# Patient Record
Sex: Female | Born: 1955 | Race: White | Hispanic: No | Marital: Married | Smoking: Former smoker
Health system: Southern US, Community
[De-identification: ages and names within clinical notes are randomized; demographics above are authoritative.]

## PROBLEM LIST (undated history)

## (undated) DIAGNOSIS — Z8669 Personal history of other diseases of the nervous system and sense organs: Secondary | ICD-10-CM

## (undated) DIAGNOSIS — C801 Malignant (primary) neoplasm, unspecified: Secondary | ICD-10-CM

## (undated) DIAGNOSIS — Z9889 Other specified postprocedural states: Secondary | ICD-10-CM

## (undated) HISTORY — PX: EYE SURGERY: SHX253

## (undated) HISTORY — PX: SHOULDER SURGERY: SHX246

## (undated) HISTORY — DX: Other specified postprocedural states: Z98.890

## (undated) HISTORY — PX: ABDOMINAL SURGERY: SHX537

## (undated) HISTORY — PX: HEMORRHOID SURGERY: SHX153

## (undated) HISTORY — PX: WRIST SURGERY: SHX841

## (undated) HISTORY — PX: BREAST SURGERY: SHX581

## (undated) HISTORY — DX: Personal history of other diseases of the nervous system and sense organs: Z86.69

## (undated) HISTORY — DX: Malignant (primary) neoplasm, unspecified: C80.1

---

## 2020-01-28 ENCOUNTER — Other Ambulatory Visit: Payer: Self-pay

## 2020-01-28 ENCOUNTER — Encounter: Payer: Self-pay | Admitting: Psychiatry

## 2020-01-28 ENCOUNTER — Encounter (INDEPENDENT_AMBULATORY_CARE_PROVIDER_SITE_OTHER): Payer: Self-pay

## 2020-01-28 ENCOUNTER — Ambulatory Visit (INDEPENDENT_AMBULATORY_CARE_PROVIDER_SITE_OTHER): Payer: BC Managed Care – PPO | Admitting: Psychiatry

## 2020-01-28 VITALS — BP 151/92 | HR 101 | Ht 62.0 in | Wt 135.0 lb

## 2020-01-28 DIAGNOSIS — G4700 Insomnia, unspecified: Secondary | ICD-10-CM

## 2020-01-28 DIAGNOSIS — F411 Generalized anxiety disorder: Secondary | ICD-10-CM | POA: Diagnosis not present

## 2020-01-28 MED ORDER — VIIBRYD STARTER PACK 10 & 20 MG PO KIT: PACK | ORAL | 0 refills | Status: DC

## 2020-01-28 NOTE — Patient Instructions (Signed)
Take Viibryd 5 mg daily with food for one week, then increase to 10 mg daily for one week, then 20 mg daily.  Call office with any questions or adverse effects.

## 2020-01-28 NOTE — Progress Notes (Signed)
[facility] MD/PA/NP Initial Note  01/28/2020 3:03 PM Rebekah Higgins  MRN:  [id number]  Chief Complaint:  Chief Complaint    Anxiety; Sleeping Problem      HPI: Patient is a 64 year old female being seen for initial evaluation for treatment of anxiety and insomnia.  She reports that in 02/14/1999 she was dx'd with breast CA and was started on Serzone by her oncologist. She reports that she has taken Serzone 300 mg po QHS long-term since that time. She decreased Nefazodone to 150 mg po QHS for about 3 months daily and then learned that the medication is no longer being manufactured. She then tried to wean herself off of Nefazodone. Has been off of Nefazodone for about 4-6 weeks. Since that time, she feels "less patient... sensitive to being told I was wrong... I don't like to make mistakes." She has had decreased motivation. Has been eating more and not been as conscious of her weight. She reports "I find more pleasure in eating that sugar than about anything, other than seeing my children and my dog." She reports binge eating and this has worsened recently. Has been eating in the middle of the night. Occ purging. She reports that she is critical of her appearance and that she was bullied as child for being overweight. She has been staying up late and then sleeping until 10 am. She reports that she has been procrastinating some. She reports fear of making a mistake and this results in difficulty making decisions and this has been increased without Nefazodone. Reports that she has some perfectionistic tendencies. Denies excessive checking behaviors. She reports some compulsive counting. Reports that contractors showing up at different times unexpectedly and this has triggered a feeling of loss of control.   She reports long-standing worry and anxiety.  Reports rumination. She reports frequently thinking about different scenarios and how she could respond. Catastrophic thinking. Denies any physical s/s with  anxiety. Denies having had any panic attacks. Denies social anxiety.   Has been feeling more down and "closer to tears than I ever have been." She falls asleep and then will wake up and eat during the night. She reports decreased sleep quantity. Fragmented sleep. Sleep has been worse since not having Nefazodone. Appetite has been increased. Motivation has been lower. Energy has been adequate. She reports that Vyvanse has been effective for her concentration and that concentration is impaired when it wears off and this has been more noticeable since stopping Nefazodone. Denies diminished interest in things. Has occ thought that she would be ok dying. Denies SI.   Denies past depressive episodes. Denies post-partum depression. Denies any past manic s/s. Denies AH or VH. Denies paranoia.   Born and raised in [place]. Reports father was a functioning alcoholic. Sister is a recovered alcoholic. Sister is 14 months younger. Pt reports that she has expected to "hold it all together." Reports that mother is narcissistic and lives in ALF. Father died in 02-14-2007. Father-in-law is 62 yo and lives at home alone.  Graduated from [employer]. 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 Married x 40 years and he has been working from home and is retiring in the next month. Reports that husband has always been working in the office. Has had extensive renovations for the past year. Daughter lives in [place]. Had breast cancer when her children were in 2nd grade and 6th grade. Daughter is now 22 yo and is a recovering  alcoholic. Son is almost 60 yo and lives in [place] and will be going to [employer] school in [place]. Has some supportive friends. 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 Enjoys  walking 4 miles a day with her dog. Husband is now wanting to join her on these walks. Greatly enjoys her children.   [employer]  Past Psychiatric Medication Trials: Nefazodone- Started in 2000.  Vyvanse- Takes for ADD. Has been helpful for focus and starting her day.  Temazepam- Has taken long-term. Initially on 15 mg po QHS,   Visit Diagnosis:    ICD-10-CM   1. Generalized anxiety disorder  F41.1 Vilazodone HCl (VIIBRYD STARTER PACK) 10 & 20 MG KIT  2. Insomnia, unspecified type  G47.00     Past Psychiatric History: Denies any past therapy or psychiatric tx.   Past Medical History:  Past Medical History:  Diagnosis Date  . Cancer ([place])   . Hx of detached retina repair     Past Surgical History:  Procedure Laterality Date  . ABDOMINAL SURGERY    . BREAST SURGERY    . EYE SURGERY    . HEMORRHOID SURGERY    . SHOULDER SURGERY    . WRIST SURGERY      Family History:  Family History  Problem Relation Age of Onset  . Alcohol abuse Father   . Alcohol abuse Sister   . Anxiety disorder Sister   . Alcohol abuse Daughter   . Anxiety disorder Mother   . Alcohol abuse Paternal Grandfather     Social History:  Social History   Socioeconomic History  . Marital status: Married    Spouse name: Not on file  . Number of children: Not on file  . Years of education: Not on file  . Highest education level: Not on file  Occupational History  . Not on file  Tobacco Use  . Smoking status: Former Research scientist (life sciences)  . Smokeless tobacco: Never Used  Substance and Sexual Activity  . Alcohol use: Yes    Comment: occ, about 2 times a month  . Drug use: Not Currently  . Sexual activity: Not on file  Other Topics Concern  . Not on file  Social History Narrative  . Not on file   Social Determinants of Health   Financial Resource Strain:   . Difficulty of Paying Living Expenses: Not on file  Food Insecurity:   . Worried About Charity fundraiser in the Last Year: Not on  file  . Ran Out of Food in the Last Year: Not on file  Transportation Needs:   . Lack of Transportation (Medical): Not on file  . Lack of Transportation (Non-Medical): Not on file  Physical Activity:   . Days of Exercise per Week: Not on file  . Minutes of Exercise per Session: Not on file  Stress:   . Feeling of Stress : Not on file  Social Connections:   . Frequency of Communication with Friends and Family: Not on file  . Frequency of Social Gatherings with Friends and Family: Not on file  . Attends Religious Services: Not on file  . Active Member of Clubs or Organizations: Not on file  . Attends Archivist Meetings: Not on file  . Marital Status: Not on file    Allergies: No Known Allergies  Metabolic Disorder Labs: No results found for: HGBA1C, MPG No results found for: PROLACTIN No results found for: CHOL, TRIG, HDL, CHOLHDL, VLDL, LDLCALC No results found for: TSH  Therapeutic Level Labs: No results found for: LITHIUM No results found for: VALPROATE No components found for:  CBMZ  Current Medications: Current Outpatient Medications  Medication Sig Dispense Refill  . CALCIUM PO Take by mouth.    . cholecalciferol (VITAMIN D3) 25 MCG (1000 UNIT) tablet Take 1,000 Units by mouth daily.    Marland Kitchen lisdexamfetamine (VYVANSE) 60 MG capsule Take 60 mg by mouth every morning.    . Multiple Vitamin (MULTIVITAMIN) tablet Take by mouth.    . pramipexole (MIRAPEX) 0.25 MG tablet TAKE 3 TABLETS BY MOUTH DAILY.    Marland Kitchen promethazine (PHENERGAN) 25 MG tablet Take 25 mg by mouth every 6 (six) hours as needed.    . SUMAtriptan (IMITREX) 100 MG tablet Take by mouth.    . temazepam (RESTORIL) 30 MG capsule Take 30 mg by mouth at bedtime as needed.    . Vilazodone HCl (VIIBRYD STARTER PACK) 10 & 20 MG KIT Take 5 mg by mouth daily for 7 days, THEN 10 mg daily for 7 days, THEN 20 mg daily. 3 kit 0   No current facility-administered medications for this visit.    Medication Side  Effects: none  Orders placed this visit:  No orders of the defined types were placed in this encounter.   Psychiatric Specialty Exam:  Review of Systems  There were no vitals taken for this visit.There is no height or weight on file to calculate BMI.  General Appearance: Meticulous, Neat and Well Groomed  Eye Contact:  Good  Speech:  Clear and Coherent, Normal Rate and Talkative  Volume:  Normal  Mood:  Anxious  Affect:  Congruent, Full Range and Anxious  Thought Process:  Coherent, Linear and Descriptions of Associations: Intact  Orientation:  Full (Time, Place, and Person)  Thought Content: Logical and Rumination   Suicidal Thoughts:  No  Homicidal Thoughts:  No  Memory:  WNL  Judgement:  Good  Insight:  Good  Psychomotor Activity:  Normal  Concentration:  Concentration: Fair and Attention Span: Good  Recall:  Good  Fund of Knowledge: Good  Language: Good  Assets:  Communication Skills Desire for Improvement Resilience Social Support Talents/Skills Vocational/Educational  ADL's:  Intact  Cognition: WNL  Prognosis:  Good    Receiving Psychotherapy: No   Treatment Plan/Recommendations: Patient seen for 70 minutes and time spent discussing anxiety signs and symptoms, as well as insomnia.  Discussed that long-term use of nefazodone may have helped with management of mood and anxiety signs and symptoms.  Discussed alternatives to nefazodone and patient's preference for medication that is pharmacologically similar to nefazodone with minimal risk for weight gain, affect of dulling, or concentration difficulties.  Discussed potential benefits, risks, and side effects of Viibryd and patient agrees to trial of Viibryd.  Patient provided with samples.  Discussed taking Viibryd with a meal to minimize risk of GI side effects.  Discussed starting Viibryd 5 mg daily for 1 week, then increasing to 10 mg daily for 1 week, then increasing to 20 mg daily for anxiety.  Discussed that she  could extend titration health by a few days if needed based on tolerability.  Discussed that insomnia may improve once underlying anxiety decreases. Patient advised to contact office with any questions, adverse effects, or acute worsening in signs and symptoms.  Patient to follow-up with  this provider in 6 weeks or sooner if clinically indicated.   Thayer Headings, PMHNP

## 2020-03-02 ENCOUNTER — Ambulatory Visit (INDEPENDENT_AMBULATORY_CARE_PROVIDER_SITE_OTHER): Payer: BC Managed Care – PPO | Admitting: Psychiatry

## 2020-03-02 ENCOUNTER — Other Ambulatory Visit: Payer: Self-pay

## 2020-03-02 ENCOUNTER — Encounter: Payer: Self-pay | Admitting: Psychiatry

## 2020-03-02 DIAGNOSIS — F411 Generalized anxiety disorder: Secondary | ICD-10-CM | POA: Diagnosis not present

## 2020-03-02 MED ORDER — VIIBRYD STARTER PACK 10 & 20 MG PO KIT: PACK | ORAL | 0 refills | Status: DC

## 2020-03-02 NOTE — Progress Notes (Signed)
Rebekah Higgins [id number] 05/19/1956 64 y.o.  Subjective:   Patient ID:  Rebekah Higgins is a 64 y.o. (DOB [birth year]) female.  Chief Complaint:  Chief Complaint  Patient presents with  . Anxiety    HPI Rebekah Higgins presents to the office today for follow-up of anxiety and depression. She has had some GI side effects with Viibryd and reports that this has been improving. She reports that she is uncertain how she is feeling with Viibryd. She reports that she feels that her response to Vyvanse is diminished since starting Viibryd and that she is less productive. She reports that she continues to have some difficulty with wanting to eat for enjoyment when she is not hungry. She reports that she will stay up later "to eat." Has been staying up until about 3 am some nights. She has plan to set a bedtime and plan what she is going to eat. She reports that she will worry about certain things and has some obsessive thoughts and compulsions. Unsure if obsessions and compulsions are improved. Continues to have frequent internal negative self talk. She reports that she continues to try to be perfectionistic and does not want someone to criticize her. Reports that she may not be as sensitive and "touchy." No change with feeling patient with others. Continues to count things. She reports that if there is a problem, she will think about various ways to fix it. Denies sad mood. Less frequent moments of feeling close to tears. Able to sleep once she takes Temazepam. Has been staying up later and taking Temazepam when time to go to bed. Continues to crave sweet things. She reports that she typically will hide and eat. She reports that her motivation and energy have been lower. Denies that this has been any worse with Viibryd. Has been exercising and walking 3.5-4 miles daily. Mowed the grass yesterday. Has been procrastinating. She reports that she has been able to make decisions. Denies SI.   Reports that while taking  Nefazodone she feels that she was coping with things better.   Yesterday was 21 years since she was dx'd with breast cancer.   Has been on Viibryd 20 mg qd for 3 weeks.   Past Psychiatric Medication Trials: Nefazodone- Started in 2000.  Vyvanse- Takes for ADD. Has been helpful for focus and starting her day.  Temazepam- Has taken long-term. Initially on 15 mg po QHS,     Review of Systems:  Review of Systems  Gastrointestinal: Positive for diarrhea.  Musculoskeletal: Negative for gait problem.  Neurological: Negative for tremors.  Psychiatric/Behavioral:       Please refer to HPI    Medications: I have reviewed the patient's current medications.  Current Outpatient Medications  Medication Sig Dispense Refill  . CALCIUM PO Take by mouth.    . cholecalciferol (VITAMIN D3) 25 MCG (1000 UNIT) tablet Take 1,000 Units by mouth daily.    Marland Kitchen lisdexamfetamine (VYVANSE) 60 MG capsule Take 60 mg by mouth every morning.    . Multiple Vitamin (MULTIVITAMIN) tablet Take by mouth.    . pramipexole (MIRAPEX) 0.25 MG tablet TAKE 3 TABLETS BY MOUTH DAILY.    Marland Kitchen promethazine (PHENERGAN) 25 MG tablet Take 25 mg by mouth every 6 (six) hours as needed.    . SUMAtriptan (IMITREX) 100 MG tablet Take by mouth.    . temazepam (RESTORIL) 30 MG capsule Take 30 mg by mouth at bedtime as needed.    . Vilazodone HCl (VIIBRYD STARTER PACK) 10 & 20  MG KIT Take 20 mg by mouth daily for 7 days, THEN 30 mg daily for 7 days, THEN 40 mg daily. 1 kit 0   No current facility-administered medications for this visit.    Medication Side Effects: Other: Diarrhea happens several hours after taking it and occurs q other day  Allergies: No Known Allergies  Past Medical History:  Diagnosis Date  . Cancer ([place])   . Hx of detached retina repair     Family History  Problem Relation Age of Onset  . Alcohol abuse Father   . Alcohol abuse Sister   . Anxiety disorder Sister   . Alcohol abuse Daughter   . Anxiety  disorder Mother   . Alcohol abuse Paternal Grandfather     Social History   Socioeconomic History  . Marital status: Married    Spouse name: Not on file  . Number of children: Not on file  . Years of education: Not on file  . Highest education level: Not on file  Occupational History  . Not on file  Tobacco Use  . Smoking status: Former Research scientist (life sciences)  . Smokeless tobacco: Never Used  Substance and Sexual Activity  . Alcohol use: Yes    Comment: occ, about 2 times a month  . Drug use: Not Currently  . Sexual activity: Not on file  Other Topics Concern  . Not on file  Social History Narrative  . Not on file   Social Determinants of Health   Financial Resource Strain:   . Difficulty of Paying Living Expenses:   Food Insecurity:   . Worried About Charity fundraiser in the Last Year:   . Arboriculturist in the Last Year:   Transportation Needs:   . Film/video editor (Medical):   Marland Kitchen Lack of Transportation (Non-Medical):   Physical Activity:   . Days of Exercise per Week:   . Minutes of Exercise per Session:   Stress:   . Feeling of Stress :   Social Connections:   . Frequency of Communication with Friends and Family:   . Frequency of Social Gatherings with Friends and Family:   . Attends Religious Services:   . Active Member of Clubs or Organizations:   . Attends Archivist Meetings:   Marland Kitchen Marital Status:   Intimate Partner Violence:   . Fear of Current or Ex-Partner:   . Emotionally Abused:   Marland Kitchen Physically Abused:   . Sexually Abused:     Past Medical History, Surgical history, Social history, and Family history were reviewed and updated as appropriate.   Please see review of systems for further details on the patient's review from today.   Objective:   Physical Exam:  BP 129/64   Pulse 81   Physical Exam Constitutional:      General: She is not in acute distress. Musculoskeletal:        General: No deformity.  Neurological:     Mental Status:  She is alert and oriented to person, place, and time.     Coordination: Coordination normal.  Psychiatric:        Attention and Perception: Attention and perception normal. She does not perceive auditory or visual hallucinations.        Mood and Affect: Mood is anxious. Mood is not depressed. Affect is not labile, blunt, angry or inappropriate.        Speech: Speech normal.        Behavior: Behavior normal.  Thought Content: Thought content normal. Thought content is not paranoid or delusional. Thought content does not include homicidal or suicidal ideation. Thought content does not include homicidal or suicidal plan.        Cognition and Memory: Cognition and memory normal.        Judgment: Judgment normal.     Comments: Insight intact     Lab Review:  No results found for: NA, K, CL, CO2, GLUCOSE, BUN, CREATININE, CALCIUM, PROT, ALBUMIN, AST, ALT, ALKPHOS, BILITOT, GFRNONAA, GFRAA  No results found for: WBC, RBC, HGB, HCT, PLT, MCV, MCH, MCHC, RDW, LYMPHSABS, MONOABS, EOSABS, BASOSABS  No results found for: POCLITH, LITHIUM   No results found for: PHENYTOIN, PHENOBARB, VALPROATE, CBMZ   .res Assessment: Plan:   Discussed several possible treatment options, to include continuing current dose of Viibryd since patient reports that she is continuing to experience some GI side effects.  Patient reports that she would like to continue to increase dose despite side effects since she is eager to experience some benefit in signs and symptoms.  Discussed continuing Viibryd 20 mg daily for another week to allow more time for side effects to resolve, and to then increase to 30 mg daily.  Advised patient to call in 2 weeks with update and discuss plan regarding whether to continue Viibryd at 30 mg dose or increase further to 40 mg daily.  Discussed also monitoring it if she continues to feel that response to Vyvanse is diminished with Viibryd. Patient to follow-up in 4 to 6 weeks or sooner if  clinically indicated. Patient advised to contact office with any questions, adverse effects, or acute worsening in signs and symptoms.  Rebekah Higgins was seen today for anxiety.  Diagnoses and all orders for this visit:  Generalized anxiety disorder -     Vilazodone HCl (VIIBRYD STARTER PACK) 10 & 20 MG KIT; Take 20 mg by mouth daily for 7 days, THEN 30 mg daily for 7 days, THEN 40 mg daily.     Please see After Visit Summary for patient specific instructions.  Future Appointments  Date Time Provider [place]  04/13/2020  1:00 PM Thayer Headings, PMHNP [facility] None    No orders of the defined types were placed in this encounter.   -------------------------------

## 2020-03-10 ENCOUNTER — Ambulatory Visit: Payer: BC Managed Care – PPO | Admitting: Psychiatry

## 2020-03-18 ENCOUNTER — Telehealth: Payer: Self-pay | Admitting: Psychiatry

## 2020-03-18 NOTE — Telephone Encounter (Signed)
Pt called to report as advised from Provider. Pt would like to increase Vilazodone. Need new Rx sent to [facility]. Any questions call pt @ [phone]

## 2020-03-19 ENCOUNTER — Other Ambulatory Visit: Payer: Self-pay

## 2020-03-19 DIAGNOSIS — F411 Generalized anxiety disorder: Secondary | ICD-10-CM

## 2020-03-19 MED ORDER — VIIBRYD 40 MG PO TABS: 40.0000 mg | ORAL_TABLET | Freq: Every day | ORAL | 0 refills | Status: DC

## 2020-03-19 NOTE — Telephone Encounter (Signed)
Rtc to patient, said she's currently taking 30 mg Viibryd, says she's doing well and ready to move to 40 mg. Informed her we would [facility] a Rx [facility]. Using samples currently. Explained a prior authorization may be needed but we would complete that if needed.

## 2020-03-20 ENCOUNTER — Emergency Department (HOSPITAL_COMMUNITY): Payer: BC Managed Care – PPO

## 2020-03-20 ENCOUNTER — Emergency Department (HOSPITAL_COMMUNITY)
Admission: EM | Admit: 2020-03-20 | Discharge: 2020-03-20 | Disposition: A | Discharge status: Home / Self Care | Payer: BC Managed Care – PPO | Attending: Emergency Medicine | Admitting: Emergency Medicine

## 2020-03-20 DIAGNOSIS — S82852A Displaced trimalleolar fracture of left lower leg, initial encounter for closed fracture: Secondary | ICD-10-CM | POA: Diagnosis not present

## 2020-03-20 DIAGNOSIS — Y999 Unspecified external cause status: Secondary | ICD-10-CM | POA: Diagnosis not present

## 2020-03-20 DIAGNOSIS — Y929 Unspecified place or not applicable: Secondary | ICD-10-CM | POA: Diagnosis not present

## 2020-03-20 DIAGNOSIS — W541XXA Struck by dog, initial encounter: Secondary | ICD-10-CM | POA: Diagnosis not present

## 2020-03-20 DIAGNOSIS — S99912A Unspecified injury of left ankle, initial encounter: Secondary | ICD-10-CM | POA: Diagnosis present

## 2020-03-20 DIAGNOSIS — Y9389 Activity, other specified: Secondary | ICD-10-CM | POA: Diagnosis not present

## 2020-03-20 MED ORDER — IBUPROFEN 600 MG PO TABS: 600.0000 mg | ORAL_TABLET | Freq: Four times a day (QID) | ORAL | 0 refills | Status: DC | PRN

## 2020-03-20 MED ORDER — ETOMIDATE 2 MG/ML IV SOLN
10.0000 mg | Freq: Once | INTRAVENOUS | Status: DC
  Filled 2020-03-20: qty 10

## 2020-03-20 MED ORDER — MIDAZOLAM HCL 2 MG/2ML IJ SOLN
1.0000 mg | Freq: Once | INTRAMUSCULAR | Status: DC
  Filled 2020-03-20: qty 2

## 2020-03-20 MED ORDER — HYDROCODONE-ACETAMINOPHEN 5-325 MG PO TABS: 1.0000 | ORAL_TABLET | ORAL | 0 refills | Status: DC | PRN

## 2020-03-20 MED ORDER — MIDAZOLAM HCL 2 MG/2ML IJ SOLN
INTRAMUSCULAR | Status: DC | PRN
  Administered 2020-03-20: 1 mg via INTRAVENOUS

## 2020-03-20 MED ORDER — ETOMIDATE 2 MG/ML IV SOLN
INTRAVENOUS | Status: DC | PRN
  Administered 2020-03-20: 10 mg via INTRAVENOUS

## 2020-03-20 NOTE — ED Triage Notes (Signed)
Pt bib ems after being knocked down by her daughters dog. +deformity to L ankle. Pt able to move her toes and states sensory is still intact. No LOC, no blood thinners. Given 161mcg fentanyl en route. VSS with ems.

## 2020-03-20 NOTE — ED Notes (Signed)
Patient Alert and oriented to baseline. Stable and ambulatory to baseline. Patient verbalized understanding of the discharge instructions.  Patient belongings were taken by the patient.   

## 2020-03-20 NOTE — ED Provider Notes (Signed)
[facility] Provider Note   CSN: [id number] Arrival date & time: 03/20/20  1100     History Chief Complaint  Patient presents with  . Ankle Pain    Rebekah Higgins is a 64 y.o. female.  Pt presents to the ED today with left ankle pain.  She was knocked down by her daughter's dog and sustained an injury to her left ankle with obvious deformity.  She was given 100 mcg IV fentanyl en route by EMS.  Pt denies any other injury.        Past Medical History:  Diagnosis Date  . Cancer ([place])   . Hx of detached retina repair     There are no problems to display for this patient.   Past Surgical History:  Procedure Laterality Date  . ABDOMINAL SURGERY    . BREAST SURGERY    . EYE SURGERY    . HEMORRHOID SURGERY    . SHOULDER SURGERY    . WRIST SURGERY       OB History   No obstetric history on file.     Family History  Problem Relation Age of Onset  . Alcohol abuse Father   . Alcohol abuse Sister   . Anxiety disorder Sister   . Alcohol abuse Daughter   . Anxiety disorder Mother   . Alcohol abuse Paternal Grandfather     Social History   Tobacco Use  . Smoking status: Former Research scientist (life sciences)  . Smokeless tobacco: Never Used  Substance Use Topics  . Alcohol use: Yes    Comment: occ, about 2 times a month  . Drug use: Not Currently    Home Medications Prior to Admission medications   Medication Sig Start Date End Date Taking? Authorizing Provider  CALCIUM PO Take by mouth.    [provider]  cholecalciferol (VITAMIN D3) 25 MCG (1000 UNIT) tablet Take 1,000 Units by mouth daily.    [provider]  HYDROcodone-acetaminophen (NORCO/VICODIN) 5-325 MG tablet Take 1 tablet by mouth every 4 (four) hours as needed. 03/20/20   Isla Pence, MD  ibuprofen (ADVIL) 600 MG tablet Take 1 tablet (600 mg total) by mouth every 6 (six) hours as needed. 03/20/20   Isla Pence, MD  lisdexamfetamine (VYVANSE) 60 MG capsule  Take 60 mg by mouth every morning.    [provider]  Multiple Vitamin (MULTIVITAMIN) tablet Take by mouth.    [provider]  pramipexole (MIRAPEX) 0.25 MG tablet TAKE 3 TABLETS BY MOUTH DAILY. 07/01/16   [provider]  promethazine (PHENERGAN) 25 MG tablet Take 25 mg by mouth every 6 (six) hours as needed. 01/19/20   [provider]  SUMAtriptan (IMITREX) 100 MG tablet Take by mouth. 03/03/16   [provider]  temazepam (RESTORIL) 30 MG capsule Take 30 mg by mouth at bedtime as needed. 01/12/20   [provider]  Vilazodone HCl (VIIBRYD STARTER PACK) 10 & 20 MG KIT Take 20 mg by mouth daily for 7 days, THEN 30 mg daily for 7 days, THEN 40 mg daily. 03/02/20 04/15/20  Thayer Headings, PMHNP  Vilazodone HCl (VIIBRYD) 40 MG TABS Take 1 tablet (40 mg total) by mouth daily. 03/19/20   Thayer Headings, [place]    Allergies    Patient has no known allergies.  Review of Systems   Review of Systems  Musculoskeletal:       Left ankle pain  All other systems reviewed and are negative.   Physical Exam  Updated Vital Signs BP (!) 149/84   Pulse 79   Temp (!) 97.3 F (36.3 C) (Oral)   Resp 16   SpO2 100%   Physical Exam Vitals and nursing note reviewed.  Constitutional:      Appearance: Normal appearance.  HENT:     Head: Normocephalic and atraumatic.     Right Ear: External ear normal.     Left Ear: External ear normal.     Nose: Nose normal.     Mouth/Throat:     Mouth: Mucous membranes are moist.     Pharynx: Oropharynx is clear.  Eyes:     Extraocular Movements: Extraocular movements intact.     Conjunctiva/sclera: Conjunctivae normal.     Pupils: Pupils are equal, round, and reactive to light.  Cardiovascular:     Rate and Rhythm: Normal rate and regular rhythm.     Pulses: Normal pulses.     Heart sounds: Normal heart sounds.  Pulmonary:     Effort: Pulmonary effort is normal.     Breath sounds: Normal breath sounds.    Abdominal:     General: Abdomen is flat. Bowel sounds are normal.     Palpations: Abdomen is soft.  Musculoskeletal:     Cervical back: Normal range of motion and neck supple.     Left ankle: Swelling and deformity present. Decreased range of motion.     Comments: + pulses intact  Skin:    General: Skin is warm.     Capillary Refill: Capillary refill takes less than 2 seconds.  Neurological:     General: No focal deficit present.     Mental Status: She is alert and oriented to person, place, and time.  Psychiatric:        Mood and Affect: Mood normal.        Behavior: Behavior normal.        Thought Content: Thought content normal.        Judgment: Judgment normal.     ED Results / Procedures / Treatments   Labs (all labs ordered are listed, but only abnormal results are displayed) Labs Reviewed - No data to display  EKG EKG Interpretation  Date/Time:  Saturday March 20 2020 11:04:57 EDT Ventricular Rate:  77 PR Interval:    QRS Duration: 151 QT Interval:  452 QTC Calculation: 512 R Axis:   25 Text Interpretation: Sinus rhythm Right bundle branch block No old tracing to compare Confirmed by Isla Pence [phone]) on 03/20/2020 11:09:52 AM   Radiology DG Ankle 2 Views Left  Result Date: 03/20/2020 CLINICAL DATA:  Post reduction. EXAM: LEFT ANKLE - 2 VIEW COMPARISON:  Previous radiograph 03/20/2020 11:23 a.m. FINDINGS: Splinting and casting material overlying the site limits assessment. There is improved alignment at the fracture site in terms of mediolateral translation of the talus relative to the tibia. Near anatomic alignment of medial and lateral malleolus. Persistent dislocation of the talus relative to the tibial articular surface with distraction of the posterior malleolus/posterior tibia approximately 11 mm on the current study. IMPRESSION: Improved appearance of fracture dislocation particularly with respect to medial-lateral translation of the talus. Persistent  dislocation of the talus, posteriorly relative to distal tibia. Electronically Signed   By: Zetta Bills M.D.   On: 03/20/2020 12:24   DG Ankle 2 Views Left  Result Date: 03/20/2020 CLINICAL DATA:  Ankle deformity. The no history of prior ankle injury. EXAM: LEFT ANKLE - 2 VIEW COMPARISON:  None. FINDINGS: Signs of trimalleolar fracture  with posterior and lateral dislocation of the talus relative to the tibia. Displacement of the lateral malleolus with over riding of the distal fracture fragment and apex medial angulation of the fracture. Displacement of the medial malleolus also with angulation, traveling with the talus laterally. Posterior tibial fracture with comminution and dislocation. IMPRESSION: Trimalleolar fracture with posterior and lateral dislocation of the talus relative to the tibia., associated with comminution of the posterior tibia at the tibial plafond. Electronically Signed   By: Zetta Bills M.D.   On: 03/20/2020 12:05    Procedures .Sedation  Date/Time: 03/20/2020 12:27 PM Performed by: Isla Pence, MD Authorized by: Isla Pence, MD   Consent:    Consent obtained:  Written   Consent given by:  Patient Universal protocol:    Immediately prior to procedure a time out was called: yes     Patient identity confirmation method:  Verbally with patient Indications:    Procedure performed:  Fracture reduction   Procedure necessitating sedation performed by:  Physician performing sedation Pre-sedation assessment:    Time since last food or drink:  5   ASA classification: class 2 - patient with mild systemic disease     Mallampati score:  I - soft palate, uvula, fauces, pillars visible   Pre-sedation assessments completed and reviewed: airway patency, cardiovascular function, hydration status, mental status, nausea/vomiting, pain level, respiratory function and temperature   Immediate pre-procedure details:    Reassessment: Patient reassessed immediately prior to  procedure     Reviewed: vital signs     Verified: bag valve mask available, emergency equipment available, intubation equipment available, IV patency confirmed, oxygen available and reversal medications available   Procedure details (see MAR for exact dosages):    Preoxygenation:  Room air   Sedation:  Etomidate and midazolam   Intended level of sedation: deep   Analgesia:  Fentanyl   Intra-procedure monitoring:  Blood pressure monitoring, cardiac monitor, continuous capnometry, continuous pulse oximetry, frequent LOC assessments and frequent vital sign checks   Intra-procedure events: none     Total Provider sedation time (minutes):  30 Post-procedure details:    Post-sedation assessment completed:  03/20/2020 12:28 PM   Attendance: Constant attendance by certified staff until patient recovered     Recovery: Patient returned to pre-procedure baseline     Patient is stable for discharge or admission: yes     Patient tolerance:  Tolerated well, no immediate complications Reduction of fracture  Date/Time: 03/20/2020 12:28 PM Performed by: Isla Pence, MD Authorized by: Isla Pence, MD  Consent: Written consent obtained. Risks and benefits: risks, benefits and alternatives were discussed Consent given by: patient Patient understanding: patient states understanding of the procedure being performed Patient identity confirmed: verbally with patient Time out: Immediately prior to procedure a "time out" was called to verify the correct patient, procedure, equipment, support staff and site/side marked as required. Preparation: Patient was prepped and draped in the usual sterile fashion. Local anesthesia used: no  Anesthesia: Local anesthesia used: no  Sedation: Patient sedated: yes Sedatives: see MAR for details  Patient tolerance: patient tolerated the procedure well with no immediate complications  .Splint Application  Date/Time: 03/20/2020 12:29 PM Performed by: Isla Pence, MD Authorized by: Isla Pence, MD   Consent:    Consent obtained:  Verbal   Consent given by:  Patient   Alternatives discussed:  No treatment Pre-procedure details:    Sensation:  Normal Procedure details:    Laterality:  Left   Location:  Ankle  Splint type:  Short leg   Supplies:  Cotton padding, plaster and elastic bandage Post-procedure details:    Pain:  Improved   Sensation:  Normal   Patient tolerance of procedure:  Tolerated well, no immediate complications .Splint Application  Date/Time: 03/20/2020 12:29 PM Performed by: Isla Pence, MD Authorized by: Isla Pence, MD   Consent:    Consent obtained:  Verbal   Consent given by:  Patient   Alternatives discussed:  No treatment Pre-procedure details:    Sensation:  Normal Procedure details:    Laterality:  Left   Location:  Ankle   Splint type:  Ankle stirrup   Supplies:  Cotton padding, elastic bandage and plaster Post-procedure details:    Pain:  Improved   Patient tolerance of procedure:  Tolerated well, no immediate complications   (including critical care time)  Medications Ordered in ED Medications  etomidate (AMIDATE) injection 10 mg (has no administration in time range)  midazolam (VERSED) injection 1 mg (has no administration in time range)  midazolam (VERSED) injection (1 mg Intravenous Given 03/20/20 1135)  etomidate (AMIDATE) injection (10 mg Intravenous Given 03/20/20 1136)    ED Course  I have reviewed the triage vital signs and the nursing notes.  Pertinent labs & imaging results that were available during my care of the patient were reviewed by me and considered in my medical decision making (see chart for details).    MDM Rules/Calculators/A&P                      Pt d/w Dr. Ninfa Linden who agrees pt will need surgery.  He said for her to call the office on Monday.  Reduction is not perfect, but it is adequate.  Her ankle is very unstable and I could feel it moving when  we put on the splint.  Pt feels much better.   Pt able to ambulate with crutches.  She knows to keep leg elevated at home and keep it iced.  Return if worse.  Final Clinical Impression(s) / ED Diagnoses Final diagnoses:  Closed trimalleolar fracture of left ankle, initial encounter    Rx / DC Orders ED Discharge Orders         Ordered    HYDROcodone-acetaminophen (NORCO/VICODIN) 5-325 MG tablet  Every 4 hours PRN     03/20/20 1343    ibuprofen (ADVIL) 600 MG tablet  Every 6 hours PRN     03/20/20 1343           Isla Pence, MD 03/20/20 1346

## 2020-03-20 NOTE — Progress Notes (Signed)
Orthopedic Tech Progress Note Patient Details:  Rebekah Higgins 1956/05/29 [id number]  Ortho Devices Type of Ortho Device: Stirrup splint, Short leg splint Ortho Device/Splint Interventions: Ordered   Post Interventions Patient Tolerated: Well Instructions Provided: Other (comment)   Majel Homer 03/20/2020, 11:51 AM

## 2020-03-20 NOTE — Progress Notes (Signed)
Orthopedic Tech Progress Note Patient Details:  Rebekah Higgins 1956/04/22 [id number]  Ortho Devices Type of Ortho Device: Crutches Ortho Device/Splint Interventions: Adjustment   Post Interventions Patient Tolerated: Ambulated well Instructions Provided: Poper ambulation with device, Care of device   Janit Pagan 03/20/2020, 1:37 PM

## 2020-03-22 ENCOUNTER — Telehealth: Payer: Self-pay | Admitting: Orthopaedic Surgery

## 2020-03-22 NOTE — Telephone Encounter (Signed)
Pt called in stating she was seen in the [facility] ER on Saturday 03/20/20 and was reffered to Dr. Ninfa Linden. The pt stated she would not be following up with Dr. Ninfa Linden because she has found someone closer to her.

## 2020-03-22 NOTE — Telephone Encounter (Signed)
FYI

## 2020-03-29 ENCOUNTER — Other Ambulatory Visit: Payer: Self-pay

## 2020-03-29 MED ORDER — VIIBRYD 40 MG PO TABS: 40.0000 mg | ORAL_TABLET | Freq: Every day | ORAL | 0 refills | Status: DC

## 2020-03-30 ENCOUNTER — Telehealth: Payer: Self-pay

## 2020-03-30 NOTE — Telephone Encounter (Signed)
Prior authorization submitted and approved for VIIBRYD 40 MG through [employer] effective 03/30/2020-03/29/2023.   Submitted through cover my meds

## 2020-04-13 ENCOUNTER — Ambulatory Visit: Payer: Self-pay | Admitting: Psychiatry

## 2020-05-05 ENCOUNTER — Encounter (INDEPENDENT_AMBULATORY_CARE_PROVIDER_SITE_OTHER): Payer: Self-pay

## 2020-05-05 ENCOUNTER — Encounter: Payer: Self-pay | Admitting: Psychiatry

## 2020-05-05 ENCOUNTER — Other Ambulatory Visit: Payer: Self-pay

## 2020-05-05 ENCOUNTER — Ambulatory Visit (INDEPENDENT_AMBULATORY_CARE_PROVIDER_SITE_OTHER): Payer: BC Managed Care – PPO | Admitting: Psychiatry

## 2020-05-05 DIAGNOSIS — F411 Generalized anxiety disorder: Secondary | ICD-10-CM

## 2020-05-05 MED ORDER — VIIBRYD 40 MG PO TABS: 40.0000 mg | ORAL_TABLET | Freq: Every day | ORAL | 0 refills | Status: DC

## 2020-05-05 NOTE — Progress Notes (Signed)
Rebekah Higgins [id number] 1956/05/07 64 y.o.  Subjective:   Patient ID:  Rebekah Higgins is a 64 y.o. (DOB May 02, 1956) female.  Chief Complaint:  Chief Complaint  Patient presents with  . Follow-up    Anxiety    HPI Rebekah Higgins presents to the office today for follow-up of anxiety and insomnia. Had 3 bones in Ankle fx and dislocation on 03/20/20 and had surgery 3 weeks ago. She reports that this has been difficult for her and has had significant pain and having difficulty with mobility. Has had less mobility and remains non-weight bearing. Had to cancel trip to see her son.   She reports, "I think I am pretty good." She reports that Viibryd "feels the same way" that she felt on Nefazadone. Denies any significant anxiety. She reports that she has been sleeping well. She reports, "I was finally able to stop doing that" in regards to eating at night. She reports that she is now having the discipline to follow an eating plan. She reports that her energy has been low with not being able to be as active since injury. Motivation has been adequate. Concentration is adequate with Vyvanse. 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 Denies SI.   Had Gi side effects initially with Viibryd increases and this has resolved.   Reports that her husband has been supportive and helpful in her recovery. Has close relationship with both children.  Past Psychiatric Medication Trials: Nefazodone- Started in 2000.  Vyvanse- Takes for ADD. Has been helpful for focus and starting her day.  Temazepam- Has taken long-term. Initially on 15 mg po QHS,     Review of Systems:  Review of Systems  Gastrointestinal: Negative.   Musculoskeletal: Negative for gait problem.       Continuing to recover from multiple ankle fractures  Psychiatric/Behavioral:       Please refer to HPI    Has upcoming apt for osteoporosis. Also has annual physical exam coming up in 1.5 weeks.    Medications: I have reviewed the patient's current medications.  Current Outpatient Medications  Medication Sig Dispense Refill  . Ascorbic Acid (VITAMIN C PO) Take by mouth.    Marland Kitchen CALCIUM PO Take by mouth.    . cholecalciferol (VITAMIN D3) 25 MCG (1000 UNIT) tablet Take 1,000 Units by mouth daily.    Marland Kitchen enoxaparin (LOVENOX) 40 MG/0.4ML injection Inject into the skin daily.    Marland Kitchen gabapentin (NEURONTIN) 300 MG capsule Take 300 mg by mouth 3 (three) times daily.    Marland Kitchen ibuprofen (ADVIL) 600 MG tablet Take 1 tablet (600 mg total) by mouth every 6 (six) hours as needed. 30 tablet 0  . lisdexamfetamine (VYVANSE) 60 MG capsule Take 60 mg by mouth every morning.    . Methylsulfonylmethane (MSM PO) Take by mouth.    . Multiple Vitamin (MULTIVITAMIN) tablet Take by mouth.    . pramipexole (MIRAPEX) 0.25 MG tablet TAKE 3 TABLETS BY MOUTH DAILY.    Marland Kitchen promethazine (PHENERGAN) 25 MG tablet Take 25 mg by mouth every 6 (six) hours as needed.    . SUMAtriptan (IMITREX) 100 MG tablet Take by mouth.    . temazepam (RESTORIL) 30 MG capsule Take 30 mg by mouth at bedtime as needed.    . Vilazodone HCl (VIIBRYD) 40 MG TABS Take 1 tablet (40 mg total) by mouth daily. 90 tablet 0  . HYDROcodone-acetaminophen (NORCO/VICODIN) 5-325 MG tablet Take 1 tablet by mouth every 4 (four)  hours as needed. (Patient not taking: Reported on 05/05/2020) 20 tablet 0   No current facility-administered medications for this visit.    Medication Side Effects: Other: Possibly waking up more if taking Viibryd later in the day  Allergies: No Known Allergies  Past Medical History:  Diagnosis Date  . Cancer ([place])   . Hx of detached retina repair     Family History  Problem Relation Age of Onset  . Alcohol abuse Father   . Alcohol abuse Sister   . Anxiety disorder Sister   . Alcohol abuse Daughter   . Anxiety disorder Mother   . Alcohol abuse Paternal Grandfather     Social History   Socioeconomic History  . Marital  status: Married    Spouse name: Not on file  . Number of children: Not on file  . Years of education: Not on file  . Highest education level: Not on file  Occupational History  . Not on file  Tobacco Use  . Smoking status: Former Research scientist (life sciences)  . Smokeless tobacco: Never Used  Substance and Sexual Activity  . Alcohol use: Yes    Comment: occ, about 2 times a month  . Drug use: Not Currently  . Sexual activity: Not on file  Other Topics Concern  . Not on file  Social History Narrative  . Not on file   Social Determinants of Health   Financial Resource Strain:   . Difficulty of Paying Living Expenses:   Food Insecurity:   . Worried About Charity fundraiser in the Last Year:   . Arboriculturist in the Last Year:   Transportation Needs:   . Film/video editor (Medical):   Marland Kitchen Lack of Transportation (Non-Medical):   Physical Activity:   . Days of Exercise per Week:   . Minutes of Exercise per Session:   Stress:   . Feeling of Stress :   Social Connections:   . Frequency of Communication with Friends and Family:   . Frequency of Social Gatherings with Friends and Family:   . Attends Religious Services:   . Active Member of Clubs or Organizations:   . Attends Archivist Meetings:   Marland Kitchen Marital Status:   Intimate Partner Violence:   . Fear of Current or Ex-Partner:   . Emotionally Abused:   Marland Kitchen Physically Abused:   . Sexually Abused:     Past Medical History, Surgical history, Social history, and Family history were reviewed and updated as appropriate.   Please see review of systems for further details on the patient's review from today.   Objective:   Physical Exam:  There were no vitals taken for this visit.  Physical Exam Constitutional:      General: She is not in acute distress. Musculoskeletal:        General: No deformity.  Neurological:     Mental Status: She is alert and oriented to person, place, and time.     Coordination: Coordination normal.   Psychiatric:        Attention and Perception: Attention and perception normal. She does not perceive auditory or visual hallucinations.        Mood and Affect: Mood normal. Mood is not anxious or depressed. Affect is not labile, blunt, angry or inappropriate.        Speech: Speech normal.        Behavior: Behavior normal.        Thought Content: Thought content normal. Thought content is not  paranoid or delusional. Thought content does not include homicidal or suicidal ideation. Thought content does not include homicidal or suicidal plan.        Cognition and Memory: Cognition and memory normal.        Judgment: Judgment normal.     Comments: Insight intact     Lab Review:  No results found for: NA, K, CL, CO2, GLUCOSE, BUN, CREATININE, CALCIUM, PROT, ALBUMIN, AST, ALT, ALKPHOS, BILITOT, GFRNONAA, GFRAA  No results found for: WBC, RBC, HGB, HCT, PLT, MCV, MCH, MCHC, RDW, LYMPHSABS, MONOABS, EOSABS, BASOSABS  No results found for: POCLITH, LITHIUM   No results found for: PHENYTOIN, PHENOBARB, VALPROATE, CBMZ   .res Assessment: Plan:   Continue Viibryd 40 mg po qd for anxiety.  Discussed that this provider could assume management of Viibryd and Temazepam if PCP prefers.  Pt to f/u in 3 months or sooner if clinically indicated.  Patient advised to contact office with any questions, adverse effects, or acute worsening in signs and symptoms.  Ardelle Anton was seen today for follow-up.  Diagnoses and all orders for this visit:  Generalized anxiety disorder -     Vilazodone HCl (VIIBRYD) 40 MG TABS; Take 1 tablet (40 mg total) by mouth daily.     Please see After Visit Summary for patient specific instructions.  Future Appointments  Date Time Provider [place]  08/05/2020 11:00 AM Thayer Headings, PMHNP CP-CP None    No orders of the defined types were placed in this encounter.   -------------------------------

## 2020-08-05 ENCOUNTER — Other Ambulatory Visit: Payer: Self-pay

## 2020-08-05 ENCOUNTER — Encounter (INDEPENDENT_AMBULATORY_CARE_PROVIDER_SITE_OTHER): Payer: Self-pay

## 2020-08-05 ENCOUNTER — Ambulatory Visit (INDEPENDENT_AMBULATORY_CARE_PROVIDER_SITE_OTHER): Payer: BC Managed Care – PPO | Admitting: Psychiatry

## 2020-08-05 ENCOUNTER — Encounter: Payer: Self-pay | Admitting: Psychiatry

## 2020-08-05 DIAGNOSIS — F411 Generalized anxiety disorder: Secondary | ICD-10-CM | POA: Diagnosis not present

## 2020-08-05 MED ORDER — VIIBRYD 40 MG PO TABS: 40.0000 mg | ORAL_TABLET | Freq: Every day | ORAL | 1 refills | Status: DC

## 2020-08-05 NOTE — Progress Notes (Signed)
Rebekah Higgins [id number] [birth year] 64 y.o.  Subjective:   Patient ID:  Rebekah Higgins is a 64 y.o. (DOB [birth year]) female.  Chief Complaint:  Chief Complaint  Patient presents with   Follow-up    Anxiety and insomnia    HPI Rebekah Higgins presents to the office today for follow-up of anxiety and insomnia. She reports improved mobility after 3 fractures in April and has been having PT three days a week. She reports that not being able to be active has been difficult for her. She has had low iron and reports that her energy has been lower. She denies any significant worry. She reports that she occasionally will feel "lacadasical" with less motivation because she has some pain and discomfort with movement. She reports that her husband "seems to get under my skin more." She reports that she has long-standing sensitivity to criticism. Sleeping well. Appetite has been good.She reports occasional emotional eating. She reports that she has gained 10-15 lbs since injury. She reports that Vyvanse remains effective for concentration. Denies SI.   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 Daughter is engaged. Son has started graduate school.   Past Psychiatric Medication Trials: Nefazodone- Started in 2000.  Vyvanse- Takes for ADD. Has been helpful for focus and starting her day.  Temazepam- Has taken long-term. Initially on 15 mg po QHS,       Review of Systems:  Review of Systems  Musculoskeletal: Negative for gait problem.       Continues to recover from 3 fractures to left LE  Neurological: Negative for tremors.  Hematological:       Iron levels were recently low  Psychiatric/Behavioral:       Please refer to HPI    Medications: I have reviewed the patient's current medications.  Current Outpatient Medications  Medication Sig Dispense Refill   Ascorbic Acid (VITAMIN C PO) Take by mouth.     CALCIUM PO Take by mouth.     cholecalciferol  (VITAMIN D3) 25 MCG (1000 UNIT) tablet Take 1,000 Units by mouth daily.     famotidine (PEPCID) 20 MG tablet Take 20 mg by mouth 2 (two) times daily.     lisdexamfetamine (VYVANSE) 60 MG capsule Take 60 mg by mouth every morning.     Methylsulfonylmethane (MSM PO) Take by mouth.     Pediatric Multivitamins-Iron (FLINTSTONES COMPLETE PO) Take by mouth.     potassium chloride (KLOR-CON) 10 MEQ tablet Take 1 tablet by mouth daily.     pramipexole (MIRAPEX) 0.25 MG tablet TAKE 3 TABLETS BY MOUTH DAILY.     Probiotic Product (PROBIOTIC PO) Take by mouth.     Romosozumab-aqqg (EVENITY) 105 MG/1.17ML SOSY injection Inject 210 mg into the skin once.     SUMAtriptan (IMITREX) 100 MG tablet Take by mouth.     temazepam (RESTORIL) 30 MG capsule Take 30 mg by mouth at bedtime as needed.     Vilazodone HCl (VIIBRYD) 40 MG TABS Take 1 tablet (40 mg total) by mouth daily. 90 tablet 1   Multiple Vitamin (MULTIVITAMIN) tablet Take by mouth.     promethazine (PHENERGAN) 25 MG tablet Take 25 mg by mouth every 6 (six) hours as needed.     No current facility-administered medications for this visit.    Medication Side Effects: None  Allergies: No Known Allergies  Past Medical History:  Diagnosis Date   Cancer ([place])    Hx of detached retina repair  Family History  Problem Relation Age of Onset   Alcohol abuse Father    Alcohol abuse Sister    Anxiety disorder Sister    Alcohol abuse Daughter    Anxiety disorder Mother    Alcohol abuse Paternal Grandfather     Social History   Socioeconomic History   Marital status: Married    Spouse name: Not on file   Number of children: Not on file   Years of education: Not on file   Highest education level: Not on file  Occupational History   Not on file  Tobacco Use   Smoking status: Former Smoker   Smokeless tobacco: Never Used  Substance and Sexual Activity   Alcohol use: Yes    Comment: occ, about 2 times a month    Drug use: Not Currently   Sexual activity: Not on file  Other Topics Concern   Not on file  Social History Narrative   Not on file   Social Determinants of Health   Financial Resource Strain:    Difficulty of Paying Living Expenses: Not on file  Food Insecurity:    Worried About Charity fundraiser in the Last Year: Not on file   [employer] of Food in the Last Year: Not on file  Transportation Needs:    Lack of Transportation (Medical): Not on file   Lack of Transportation (Non-Medical): Not on file  Physical Activity:    Days of Exercise per Week: Not on file   Minutes of Exercise per Session: Not on file  Stress:    Feeling of Stress : Not on file  Social Connections:    Frequency of Communication with Friends and Family: Not on file   Frequency of Social Gatherings with Friends and Family: Not on file   Attends Religious Services: Not on file   Active Member of Clubs or Organizations: Not on file   Attends Archivist Meetings: Not on file   Marital Status: Not on file  Intimate Partner Violence:    Fear of Current or Ex-Partner: Not on file   Emotionally Abused: Not on file   Physically Abused: Not on file   Sexually Abused: Not on file    Past Medical History, Surgical history, Social history, and Family history were reviewed and updated as appropriate.   Please see review of systems for further details on the patient's review from today.   Objective:   Physical Exam:  There were no vitals taken for this visit.  Physical Exam Constitutional:      General: She is not in acute distress. Musculoskeletal:        General: No deformity.  Neurological:     Mental Status: She is alert and oriented to person, place, and time.     Coordination: Coordination normal.  Psychiatric:        Attention and Perception: Attention and perception normal. She does not perceive auditory or visual hallucinations.        Mood and Affect: Mood normal.  Mood is not anxious or depressed. Affect is not labile, blunt, angry or inappropriate.        Speech: Speech normal.        Behavior: Behavior normal.        Thought Content: Thought content normal. Thought content is not paranoid or delusional. Thought content does not include homicidal or suicidal ideation. Thought content does not include homicidal or suicidal plan.        Cognition and Memory:  Cognition and memory normal.        Judgment: Judgment normal.     Comments: Insight intact     Lab Review:  No results found for: NA, K, CL, CO2, GLUCOSE, BUN, CREATININE, CALCIUM, PROT, ALBUMIN, AST, ALT, ALKPHOS, BILITOT, GFRNONAA, GFRAA  No results found for: WBC, RBC, HGB, HCT, PLT, MCV, MCH, MCHC, RDW, LYMPHSABS, MONOABS, EOSABS, BASOSABS  No results found for: POCLITH, LITHIUM   No results found for: PHENYTOIN, PHENOBARB, VALPROATE, CBMZ   .res Assessment: Plan:   Patient seen for 30 minutes and time spent counseling patient regarding possible treatment options for compulsive over eating.  Discussed considering trial of N-acetylcysteine 600 mg twice daily for compulsive overeating. Will continue Viibryd 40 mg daily for anxiety. Vyvanse managed by PCP and patient reports that PCP wishes to continue to manage Vyvanse. Patient to follow-up in 3 months or sooner if clinically indicated. Patient advised to contact office with any questions, adverse effects, or acute worsening in signs and symptoms.  Ninnie was seen today for follow-up.  Diagnoses and all orders for this visit:  Generalized anxiety disorder -     Vilazodone HCl (VIIBRYD) 40 MG TABS; Take 1 tablet (40 mg total) by mouth daily.     Please see After Visit Summary for patient specific instructions.  Future Appointments  Date Time Provider [place]  11/09/2020  3:45 PM Thayer Headings, PMHNP CP-[facility] None    No orders of the defined types were placed in this encounter.   -------------------------------

## 2020-11-09 ENCOUNTER — Encounter: Payer: Self-pay | Admitting: Psychiatry

## 2020-11-09 ENCOUNTER — Ambulatory Visit (INDEPENDENT_AMBULATORY_CARE_PROVIDER_SITE_OTHER): Payer: Medicare Other | Admitting: Psychiatry

## 2020-11-09 ENCOUNTER — Other Ambulatory Visit: Payer: Self-pay

## 2020-11-09 DIAGNOSIS — G4700 Insomnia, unspecified: Secondary | ICD-10-CM

## 2020-11-09 DIAGNOSIS — G2581 Restless legs syndrome: Secondary | ICD-10-CM | POA: Diagnosis not present

## 2020-11-09 DIAGNOSIS — F411 Generalized anxiety disorder: Secondary | ICD-10-CM | POA: Diagnosis not present

## 2020-11-09 MED ORDER — PRAMIPEXOLE DIHYDROCHLORIDE 0.25 MG PO TABS: ORAL_TABLET | ORAL | 1 refills | Status: DC

## 2020-11-09 NOTE — Progress Notes (Signed)
Rebekah Higgins [id number] [birth year] 64 y.o.  Subjective:   Patient ID:  Rebekah Higgins is a 64 y.o. (DOB [birth year]) female.  Chief Complaint:  Chief Complaint  Patient presents with  . Depression  . Follow-up    Anxiety    HPI Rebekah Higgins presents to the office today for follow-up of anxiety and depression. She reports difficulty getting motivated to do things and would prefer to stay in her bathrobe and watch TV. She reports that she has been staying up later and sleeping later. Reports that when she stays up at night she tends to eat more. Has gained 15 lbs since her injury. She reports that she has had difficulty resisting cravings for sweets. Physical activity has been limited by ankle fracture. Energy and motivation have been low. She reports that she is not tearful. She reports feeling that she "doesn't want to be bothered." She reports that she has anxiety when someone yells from another room. She reports that she is "a little thin skinned" and interpreting it in a negative way. Denies anxiety. "I feel in the doldrums." Has had some difficulty with concentration, particularly in the evening. PCP recently added Adderall 5 mg tabs prn. She reports that she is putting things off, which is not typical for her. Denies anhedonia. Denies SI.   She reports that Vyvanse tends to wear off around 4 pm and experiences a decrease in energy, motivation, and concentration.   Has continued to go to PT twice a week since ankle fracture 7 months ago. Reports that she continues ot have pain on a daily basis.   Daughter has been with someone for 7 years and was planning a wedding for May and he recently ended relationship. 18 yo mother has had recent worsening memory issues. Recently took trip to [place] and enjoyed this.    Has been a IT sales professional.  Reports that she was taking Pramipexole daily until about 6-8 months ago and has not taken it since then since RLS has been  controlled.   Past Psychiatric Medication Trials: Nefazodone- Started in 2000.  Viibryd Vyvanse- Takes for ADD. Has been helpful for focus and starting her day.  Temazepam- Has taken long-term. Initially on 15 mg po QHS,   Review of Systems:  Review of Systems  Gastrointestinal: Negative.   Musculoskeletal: Negative for gait problem.       Continued pain in her ankle  Psychiatric/Behavioral:       Please refer to HPI    Medications: I have reviewed the patient's current medications.  Current Outpatient Medications  Medication Sig Dispense Refill  . amphetamine-dextroamphetamine (ADDERALL) 5 MG tablet Take by mouth.    . Ascorbic Acid (VITAMIN C PO) Take by mouth.    Marland Kitchen CALCIUM PO Take by mouth.    . cholecalciferol (VITAMIN D3) 25 MCG (1000 UNIT) tablet Take 1,000 Units by mouth daily.    . famotidine (PEPCID) 20 MG tablet Take 20 mg by mouth 2 (two) times daily.    Marland Kitchen lisdexamfetamine (VYVANSE) 60 MG capsule Take 60 mg by mouth every morning.    . Methylsulfonylmethane (MSM PO) Take by mouth.    . Multiple Vitamin (MULTIVITAMIN) tablet Take by mouth.    . Pediatric Multivitamins-Iron (FLINTSTONES COMPLETE PO) Take by mouth.    . potassium chloride (KLOR-CON) 10 MEQ tablet Take 1 tablet by mouth daily.    . pramipexole (MIRAPEX) 0.25 MG tablet Take 1 tablet for 4 days, then increase to 2 tablets daily every 4  days, then increase to 3 tablets daily 90 tablet 1  . Probiotic Product (PROBIOTIC PO) Take by mouth.    . promethazine (PHENERGAN) 25 MG tablet Take 25 mg by mouth every 6 (six) hours as needed.    . SUMAtriptan (IMITREX) 100 MG tablet Take by mouth.    . temazepam (RESTORIL) 30 MG capsule Take 30 mg by mouth at bedtime as needed.    . Vilazodone HCl (VIIBRYD) 40 MG TABS Take 1 tablet (40 mg total) by mouth daily. 90 tablet 1  . Romosozumab-aqqg (EVENITY) 105 MG/1.17ML SOSY injection Inject 210 mg into the skin once.     No current facility-administered medications for  this visit.    Medication Side Effects: None  Allergies: No Known Allergies  Past Medical History:  Diagnosis Date  . Cancer ([place])   . Hx of detached retina repair     Family History  Problem Relation Age of Onset  . Alcohol abuse Father   . Alcohol abuse Sister   . Anxiety disorder Sister   . Alcohol abuse Daughter   . Anxiety disorder Mother   . Alcohol abuse Paternal Grandfather     Social History   Socioeconomic History  . Marital status: Married    Spouse name: Not on file  . Number of children: Not on file  . Years of education: Not on file  . Highest education level: Not on file  Occupational History  . Not on file  Tobacco Use  . Smoking status: Former Research scientist (life sciences)  . Smokeless tobacco: Never Used  Substance and Sexual Activity  . Alcohol use: Yes    Comment: occ, about 2 times a month  . Drug use: Not Currently  . Sexual activity: Not on file  Other Topics Concern  . Not on file  Social History Narrative  . Not on file   Social Determinants of Health   Financial Resource Strain:   . Difficulty of Paying Living Expenses: Not on file  Food Insecurity:   . Worried About Charity fundraiser in the Last Year: Not on file  . Ran Out of Food in the Last Year: Not on file  Transportation Needs:   . Lack of Transportation (Medical): Not on file  . Lack of Transportation (Non-Medical): Not on file  Physical Activity:   . Days of Exercise per Week: Not on file  . Minutes of Exercise per Session: Not on file  Stress:   . Feeling of Stress : Not on file  Social Connections:   . Frequency of Communication with Friends and Family: Not on file  . Frequency of Social Gatherings with Friends and Family: Not on file  . Attends Religious Services: Not on file  . Active Member of Clubs or Organizations: Not on file  . Attends Archivist Meetings: Not on file  . Marital Status: Not on file  Intimate Partner Violence:   . Fear of Current or Ex-Partner: Not  on file  . Emotionally Abused: Not on file  . Physically Abused: Not on file  . Sexually Abused: Not on file    Past Medical History, Surgical history, Social history, and Family history were reviewed and updated as appropriate.   Please see review of systems for further details on the patient's review from today.   Objective:   Physical Exam:  There were no vitals taken for this visit.  Physical Exam Constitutional:      General: She is not in acute distress. Musculoskeletal:  General: No deformity.  Neurological:     Mental Status: She is alert and oriented to person, place, and time.     Coordination: Coordination normal.  Psychiatric:        Attention and Perception: Attention and perception normal. She does not perceive auditory or visual hallucinations.        Mood and Affect: Mood is depressed. Mood is not anxious. Affect is not labile, blunt, angry or inappropriate.        Speech: Speech normal.        Behavior: Behavior normal.        Thought Content: Thought content normal. Thought content is not paranoid or delusional. Thought content does not include homicidal or suicidal ideation. Thought content does not include homicidal or suicidal plan.        Cognition and Memory: Cognition and memory normal.        Judgment: Judgment normal.     Comments: Insight intact     Lab Review:  No results found for: NA, K, CL, CO2, GLUCOSE, BUN, CREATININE, CALCIUM, PROT, ALBUMIN, AST, ALT, ALKPHOS, BILITOT, GFRNONAA, GFRAA  No results found for: WBC, RBC, HGB, HCT, PLT, MCV, MCH, MCHC, RDW, LYMPHSABS, MONOABS, EOSABS, BASOSABS  No results found for: POCLITH, LITHIUM   No results found for: PHENYTOIN, PHENOBARB, VALPROATE, CBMZ   .res Assessment: Plan:   Pt seen for 30 minutes and time spent counseling pt regarding possible treatment options. Discussed that Pramipexole is used off label for depression and that Pramipexole may have been having benefits for mood in  addition to RLS since she reports that she stopped taking Pramipexole around the time that depressive s/s started.  Also discussed potential benefits, risks, and side effects of Wellbutrin XL for augmentation of depression.  Pt reports that she would prefer to re-start Pramipexole since she has tolerated this well in the past and thinks that depressive s/s coincide with when she stopped taking it regularly. She reports that she has taken Pramipexole 0.75 mg prn RLS. Discussed that Pramipexole can cause nausea, particularly if dose is increased rapidly. Recommend starting at 0.25 mg daily and increasing by one tablet every 4 days as tolerated.  Continue Viibryd for anxiety and depression.  Pt to follow-up in 6 weeks or sooner if clinically indicated.  Patient advised to contact office with any questions, adverse effects, or acute worsening in signs and symptoms.   Preeti was seen today for depression and follow-up.  Diagnoses and all orders for this visit:  Restless legs syndrome (RLS) -     pramipexole (MIRAPEX) 0.25 MG tablet; Take 1 tablet for 4 days, then increase to 2 tablets daily every 4 days, then increase to 3 tablets daily     Please see After Visit Summary for patient specific instructions.  Future Appointments  Date Time Provider [place]  12/28/2020  9:30 AM Thayer Headings, PMHNP CP-CP None    No orders of the defined types were placed in this encounter.   -------------------------------

## 2020-12-03 ENCOUNTER — Other Ambulatory Visit: Payer: Self-pay | Admitting: Psychiatry

## 2020-12-03 DIAGNOSIS — G2581 Restless legs syndrome: Secondary | ICD-10-CM

## 2020-12-28 ENCOUNTER — Ambulatory Visit: Payer: Medicare Other | Admitting: Psychiatry

## 2021-01-10 ENCOUNTER — Telehealth: Payer: Medicare Other | Admitting: Psychiatry

## 2021-02-02 ENCOUNTER — Other Ambulatory Visit: Payer: Self-pay | Admitting: Psychiatry

## 2021-02-02 DIAGNOSIS — G2581 Restless legs syndrome: Secondary | ICD-10-CM

## 2021-03-02 ENCOUNTER — Ambulatory Visit (INDEPENDENT_AMBULATORY_CARE_PROVIDER_SITE_OTHER): Payer: Medicare Other | Admitting: Psychiatry

## 2021-03-02 ENCOUNTER — Encounter: Payer: Self-pay | Admitting: Psychiatry

## 2021-03-02 ENCOUNTER — Other Ambulatory Visit: Payer: Self-pay

## 2021-03-02 DIAGNOSIS — F411 Generalized anxiety disorder: Secondary | ICD-10-CM | POA: Diagnosis not present

## 2021-03-02 DIAGNOSIS — G2581 Restless legs syndrome: Secondary | ICD-10-CM

## 2021-03-02 MED ORDER — BUPROPION HCL ER (XL) 150 MG PO TB24: 150.0000 mg | ORAL_TABLET | Freq: Every day | ORAL | 1 refills | Status: DC

## 2021-03-02 MED ORDER — PRAMIPEXOLE DIHYDROCHLORIDE 0.75 MG PO TABS: 0.7500 mg | ORAL_TABLET | Freq: Every day | ORAL | 0 refills | Status: DC

## 2021-03-02 MED ORDER — VIIBRYD 40 MG PO TABS: 40.0000 mg | ORAL_TABLET | Freq: Every day | ORAL | 1 refills | Status: DC

## 2021-03-02 NOTE — Progress Notes (Signed)
Rebekah Higgins [id number] [birth year] 65 y.o.  Subjective:   Patient ID:  Rebekah Higgins is a 66 y.o. (DOB 04-22-1956) female.  Chief Complaint:  Chief Complaint  Patient presents with  . Follow-up    Low energy, low motivation, history of anxiety    HPI Rebekah Higgins presents to the office today for follow-up of anxiety and mild depressive s/s. She reports, "I don't know if I am as good as I can be." She reports that if she did not take Vyvanse she not be able to accomplish things. She reports that she feels better than she did before starting Viibryd. Denies significant anxiety. She reports that she feels less motivated. She reports that her "mental energy" is low. Denies sad mood. Denies anhedonia. She reports that concentration is adequate with Vyvanse and Adderall.  She reports that she and her husband have had some tension and that their children have recommended they see a couples counselor. Husband's father died 5 weeks ago. She reports that they are considering seeing a couple's therapist. She reports that she does "not want to show weakness" in front of husband.   She reports that she is struggling with eating and is craving sugar and has difficulty controlling sugar cravings. She reports that she will periodically eat a bag of candy in one sitting. She reports that she has not been wanting to see other people because she "feels bad about myself" due to weight gain. Sleeping well. Denies SI.   She continues to go to PT three times a week for lower extremity fracture after having 2 surgeries.   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   Son is going to graduate school.   Past Psychiatric Medication Trials: Nefazodone- Started in 2000.  Viibryd Vyvanse- Takes for ADD. Has been helpful for focus and starting her day.  Temazepam- Has taken long-term. Initially on 15 mg po QHS,  Review of Systems:  Review of Systems  Gastrointestinal: Negative.    Musculoskeletal:       Continues to go for PT   Neurological: Negative for tremors.  Psychiatric/Behavioral:       Please refer to HPI  Rare RLS, typically when she is very tired.   Medications: I have reviewed the patient's current medications.  Current Outpatient Medications  Medication Sig Dispense Refill  . amphetamine-dextroamphetamine (ADDERALL) 10 MG tablet Take by mouth.    . Ascorbic Acid (VITAMIN C PO) Take by mouth.    Marland Kitchen buPROPion (WELLBUTRIN XL) 150 MG 24 hr tablet Take 1 tablet (150 mg total) by mouth daily. 30 tablet 1  . CALCIUM PO Take by mouth.    . cholecalciferol (VITAMIN D3) 25 MCG (1000 UNIT) tablet Take 1,000 Units by mouth daily.    . famotidine (PEPCID) 20 MG tablet Take 20 mg by mouth daily as needed.    Marland Kitchen lisdexamfetamine (VYVANSE) 60 MG capsule Take 60 mg by mouth every morning.    . Methylsulfonylmethane (MSM PO) Take by mouth.    . Multiple Vitamin (MULTIVITAMIN) tablet Take by mouth.    . Pediatric Multivitamins-Iron (FLINTSTONES COMPLETE PO) Take by mouth.    . potassium chloride (KLOR-CON) 10 MEQ tablet Take 1 tablet by mouth daily.    . Probiotic Product (PROBIOTIC PO) Take by mouth.    . Romosozumab-aqqg (EVENITY) 105 MG/1.17ML SOSY injection Inject 210 mg into the skin once.    . SUMAtriptan (IMITREX) 100 MG tablet Take by mouth.    . temazepam (RESTORIL)  30 MG capsule Take 30 mg by mouth at bedtime as needed.    . pramipexole (MIRAPEX) 0.75 MG tablet Take 1 tablet (0.75 mg total) by mouth daily at 12 noon. 90 tablet 0  . promethazine (PHENERGAN) 25 MG tablet Take 25 mg by mouth every 6 (six) hours as needed.    . Vilazodone HCl (VIIBRYD) 40 MG TABS Take 1 tablet (40 mg total) by mouth daily. 90 tablet 1   No current facility-administered medications for this visit.    Medication Side Effects: None  Allergies: No Known Allergies  Past Medical History:  Diagnosis Date  . Cancer ([place])   . Hx of detached retina repair     Family History   Problem Relation Age of Onset  . Alcohol abuse Father   . Alcohol abuse Sister   . Anxiety disorder Sister   . Alcohol abuse Daughter   . Anxiety disorder Mother   . Alcohol abuse Paternal Grandfather     Social History   Socioeconomic History  . Marital status: Married    Spouse name: Not on file  . Number of children: Not on file  . Years of education: Not on file  . Highest education level: Not on file  Occupational History  . Not on file  Tobacco Use  . Smoking status: Former Research scientist (life sciences)  . Smokeless tobacco: Never Used  Substance and Sexual Activity  . Alcohol use: Yes    Comment: occ, about 2 times a month  . Drug use: Not Currently  . Sexual activity: Not on file  Other Topics Concern  . Not on file  Social History Narrative  . Not on file   Social Determinants of Health   Financial Resource Strain: Not on file  Food Insecurity: Not on file  Transportation Needs: Not on file  Physical Activity: Not on file  Stress: Not on file  Social Connections: Not on file  Intimate Partner Violence: Not on file    Past Medical History, Surgical history, Social history, and Family history were reviewed and updated as appropriate.   Please see review of systems for further details on the patient's review from today.   Objective:   Physical Exam:  There were no vitals taken for this visit.  Physical Exam Constitutional:      General: She is not in acute distress. Musculoskeletal:        General: No deformity.  Neurological:     Mental Status: She is alert and oriented to person, place, and time.     Coordination: Coordination normal.  Psychiatric:        Attention and Perception: Attention and perception normal. She does not perceive auditory or visual hallucinations.        Mood and Affect: Mood is not anxious. Affect is not labile, blunt, angry or inappropriate.        Speech: Speech normal.        Behavior: Behavior normal.        Thought Content: Thought  content normal. Thought content is not paranoid or delusional. Thought content does not include homicidal or suicidal ideation. Thought content does not include homicidal or suicidal plan.        Cognition and Memory: Cognition and memory normal.        Judgment: Judgment normal.     Comments: Insight intact Dysthymic mood     Lab Review:  No results found for: NA, K, CL, CO2, GLUCOSE, BUN, CREATININE, CALCIUM, PROT, ALBUMIN, AST, ALT, ALKPHOS,  BILITOT, GFRNONAA, GFRAA  No results found for: WBC, RBC, HGB, HCT, PLT, MCV, MCH, MCHC, RDW, LYMPHSABS, MONOABS, EOSABS, BASOSABS  No results found for: POCLITH, LITHIUM   No results found for: PHENYTOIN, PHENOBARB, VALPROATE, CBMZ   .res Assessment: Plan:    Patient seen for 30 minutes and time spent discussing possible treatment options with patient, continuing include discussing potential benefits, risks, and side effects of adding Wellbutrin XL to target low energy and low motivation.  Discussed that Wellbutrin is used off label for attention deficit disorder and may also be potentially helpful for cravings for sweets.  Patient agrees to trial of Wellbutrin XL 150 mg daily. Continue Viibryd 40 mg daily for anxiety. Continue pramipexole 0.5 mg daily since patient reports that this has been helpful for her restless legs and mood. Patient to follow-up in 1 to 2 months or sooner if clinically indicated. Patient advised to contact office with any questions, adverse effects, or acute worsening in signs and symptoms.   Rebekah Higgins was seen today for follow-up.  Diagnoses and all orders for this visit:  Generalized anxiety disorder -     Vilazodone HCl (VIIBRYD) 40 MG TABS; Take 1 tablet (40 mg total) by mouth daily.  Restless legs syndrome (RLS) -     pramipexole (MIRAPEX) 0.75 MG tablet; Take 1 tablet (0.75 mg total) by mouth daily at 12 noon.  Other orders -     buPROPion (WELLBUTRIN XL) 150 MG 24 hr tablet; Take 1 tablet (150 mg total) by  mouth daily.     Please see After Visit Summary for patient specific instructions.  Future Appointments  Date Time Provider [place]  04/13/2021  1:30 PM Thayer Headings, PMHNP CP-CP None    No orders of the defined types were placed in this encounter.   -------------------------------

## 2021-03-24 ENCOUNTER — Other Ambulatory Visit: Payer: Self-pay | Admitting: Psychiatry

## 2021-04-13 ENCOUNTER — Ambulatory Visit: Payer: Medicare Other | Admitting: Psychiatry

## 2021-04-28 ENCOUNTER — Encounter: Payer: Self-pay | Admitting: Psychiatry

## 2021-04-28 ENCOUNTER — Ambulatory Visit (INDEPENDENT_AMBULATORY_CARE_PROVIDER_SITE_OTHER): Payer: Medicare Other | Admitting: Psychiatry

## 2021-04-28 ENCOUNTER — Other Ambulatory Visit: Payer: Self-pay

## 2021-04-28 DIAGNOSIS — G2581 Restless legs syndrome: Secondary | ICD-10-CM | POA: Diagnosis not present

## 2021-04-28 DIAGNOSIS — F411 Generalized anxiety disorder: Secondary | ICD-10-CM | POA: Diagnosis not present

## 2021-04-28 MED ORDER — BUPROPION HCL ER (XL) 300 MG PO TB24: 300.0000 mg | ORAL_TABLET | Freq: Every day | ORAL | 2 refills | Status: DC

## 2021-04-28 MED ORDER — PRAMIPEXOLE DIHYDROCHLORIDE 0.75 MG PO TABS: 0.7500 mg | ORAL_TABLET | Freq: Every day | ORAL | 0 refills | Status: DC

## 2021-04-28 NOTE — Progress Notes (Signed)
Rebekah Higgins [id number] [birth year] 65 y.o.  Subjective:   Patient ID:  Rebekah Higgins is a 65 y.o. (DOB March 19, 1956) female.  Chief Complaint:  Chief Complaint  Patient presents with  . Other    Low energy    HPI Rebekah Higgins presents to the office today for follow-up of anxiety and ADHD. She reports that Wellbutrin XL seemed to be partially effective. She has noticed a slight improvement in energy, with continued low energy. She reports that her motivation is unchanged. She reports that her mood has been "pretty good." She reports that her concentration may be slightly improved. She reports frequent worry. Denies worsening anxiety or irritability. Denies impulsivity. Sleeping ok. Appetite has been high and denies any appetite suppression with Wellbutrin. She reports binge eating has stopped and that she has been conscious of this. She will eat a small amount of candy and then throw the rest away. Denies SI.   She reports that she and her husband have been married for 40 years and "we don't talk too nicely to each other." She questions if husband is passive aggressive at times. She reports that husband will remind her to do things or tell her things that she knows to do. Husband used to work 10-14 hours a day and retired recently. Husband's father died in 01/31/2023. She reports that their children have suggested that they talk with a therapist.    Daughter recently moved to [place] area. 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   Past Psychiatric Medication Trials: Nefazodone- Started in 2000. Viibryd Vyvanse- Takes for ADD. Has been helpful for focus and starting her day.  Temazepam- Has taken long-term. Initially on 15 mg po QHS,  Review of Systems:  Review of Systems  Musculoskeletal: Negative for gait problem.       Continued leg pain. Goes to PT weekly  Neurological: Negative for tremors.  Psychiatric/Behavioral:       Please  refer to HPI    Medications: I have reviewed the patient's current medications.  Current Outpatient Medications  Medication Sig Dispense Refill  . buPROPion (WELLBUTRIN XL) 300 MG 24 hr tablet Take 1 tablet (300 mg total) by mouth daily. 30 tablet 2  . amphetamine-dextroamphetamine (ADDERALL) 10 MG tablet Take by mouth.    . Ascorbic Acid (VITAMIN C PO) Take by mouth.    Marland Kitchen CALCIUM PO Take by mouth.    . cholecalciferol (VITAMIN D3) 25 MCG (1000 UNIT) tablet Take 1,000 Units by mouth daily.    . famotidine (PEPCID) 20 MG tablet Take 20 mg by mouth daily as needed.    Marland Kitchen lisdexamfetamine (VYVANSE) 60 MG capsule Take 60 mg by mouth every morning.    . Methylsulfonylmethane (MSM PO) Take by mouth.    . Multiple Vitamin (MULTIVITAMIN) tablet Take by mouth.    . Pediatric Multivitamins-Iron (FLINTSTONES COMPLETE PO) Take by mouth.    . potassium chloride (KLOR-CON) 10 MEQ tablet Take 1 tablet by mouth daily.    . pramipexole (MIRAPEX) 0.75 MG tablet Take 1 tablet (0.75 mg total) by mouth daily at 12 noon. 90 tablet 0  . Probiotic Product (PROBIOTIC PO) Take by mouth.    . promethazine (PHENERGAN) 25 MG tablet Take 25 mg by mouth every 6 (six) hours as needed.    . Romosozumab-aqqg (EVENITY) 105 MG/1.17ML SOSY injection Inject 210 mg into the skin once.    . SUMAtriptan (IMITREX) 100 MG tablet Take by mouth.    Marland Kitchen  temazepam (RESTORIL) 30 MG capsule Take 30 mg by mouth at bedtime as needed.    . Vilazodone HCl (VIIBRYD) 40 MG TABS Take 1 tablet (40 mg total) by mouth daily. 90 tablet 1   No current facility-administered medications for this visit.    Medication Side Effects: None  Allergies: No Known Allergies  Past Medical History:  Diagnosis Date  . Cancer ([place])   . Hx of detached retina repair     Past Medical History, Surgical history, Social history, and Family history were reviewed and updated as appropriate.   Please see review of systems for further details on the patient's  review from today.   Objective:   Physical Exam:  There were no vitals taken for this visit.  Physical Exam Constitutional:      General: She is not in acute distress. Musculoskeletal:        General: No deformity.  Neurological:     Mental Status: She is alert and oriented to person, place, and time.     Coordination: Coordination normal.  Psychiatric:        Attention and Perception: Attention and perception normal. She does not perceive auditory or visual hallucinations.        Mood and Affect: Mood normal. Mood is not anxious or depressed. Affect is not labile, blunt, angry or inappropriate.        Speech: Speech normal.        Behavior: Behavior normal.        Thought Content: Thought content normal. Thought content is not paranoid or delusional. Thought content does not include homicidal or suicidal ideation. Thought content does not include homicidal or suicidal plan.        Cognition and Memory: Cognition and memory normal.        Judgment: Judgment normal.     Comments: Insight intact     Lab Review:  No results found for: NA, K, CL, CO2, GLUCOSE, BUN, CREATININE, CALCIUM, PROT, ALBUMIN, AST, ALT, ALKPHOS, BILITOT, GFRNONAA, GFRAA  No results found for: WBC, RBC, HGB, HCT, PLT, MCV, MCH, MCHC, RDW, LYMPHSABS, MONOABS, EOSABS, BASOSABS  No results found for: POCLITH, LITHIUM   No results found for: PHENYTOIN, PHENOBARB, VALPROATE, CBMZ   .res Assessment: Plan:   Patient seen for 30 minutes and time spent counseling the patient regarding potential benefits, risks, and side effects of increasing Wellbutrin XL to 300 mg daily for further improvement in energy, motivation, and binge eating   Since she has noticed a partial response with 150 mg dose without any side effects. Continue pramipexole for restless legs and since this seems to have some benefit for her mood. Continue Viibryd 40 mg daily for mood and anxiety. Temazepam, Vyvanse, and Adderall are managed by  PCP. Patient to follow-up in 4 to 6 weeks or sooner if clinically indicated. Patient advised to contact office with any questions, adverse effects, or acute worsening in signs and symptoms.  Kabella was seen today for other.  Diagnoses and all orders for this visit:  Generalized anxiety disorder  Restless legs syndrome (RLS) -     pramipexole (MIRAPEX) 0.75 MG tablet; Take 1 tablet (0.75 mg total) by mouth daily at 12 noon.  Other orders -     buPROPion (WELLBUTRIN XL) 300 MG 24 hr tablet; Take 1 tablet (300 mg total) by mouth daily.     Please see After Visit Summary for patient specific instructions.  Future Appointments  Date Time Provider [place]  06/09/2021 11:30  AM Thayer Headings, PMHNP CP-CP None    No orders of the defined types were placed in this encounter.   -------------------------------

## 2021-05-03 ENCOUNTER — Other Ambulatory Visit: Payer: Self-pay | Admitting: Psychiatry

## 2021-05-03 DIAGNOSIS — G2581 Restless legs syndrome: Secondary | ICD-10-CM

## 2021-05-26 ENCOUNTER — Ambulatory Visit: Payer: Medicare Other | Admitting: Psychiatry

## 2021-05-28 ENCOUNTER — Other Ambulatory Visit: Payer: Self-pay | Admitting: Psychiatry

## 2021-06-09 ENCOUNTER — Other Ambulatory Visit: Payer: Self-pay

## 2021-06-09 ENCOUNTER — Encounter: Payer: Self-pay | Admitting: Psychiatry

## 2021-06-09 ENCOUNTER — Ambulatory Visit (INDEPENDENT_AMBULATORY_CARE_PROVIDER_SITE_OTHER): Payer: Medicare Other | Admitting: Psychiatry

## 2021-06-09 DIAGNOSIS — G2581 Restless legs syndrome: Secondary | ICD-10-CM | POA: Diagnosis not present

## 2021-06-09 DIAGNOSIS — F411 Generalized anxiety disorder: Secondary | ICD-10-CM

## 2021-06-09 MED ORDER — VILAZODONE HCL 40 MG PO TABS: 40.0000 mg | ORAL_TABLET | Freq: Every day | ORAL | 1 refills | Status: DC

## 2021-06-09 MED ORDER — PRAMIPEXOLE DIHYDROCHLORIDE 0.75 MG PO TABS
0.7500 mg | ORAL_TABLET | Freq: Every day | ORAL | 0 refills | Status: DC
Start: 2021-06-09 — End: 2021-09-13

## 2021-06-09 MED ORDER — BUPROPION HCL ER (XL) 300 MG PO TB24: 300.0000 mg | ORAL_TABLET | Freq: Every day | ORAL | 1 refills | Status: DC

## 2021-06-09 NOTE — Progress Notes (Signed)
Rebekah Higgins [id number] May 13, 1956 65 y.o.  Subjective:   Patient ID:  Rebekah Higgins is a 65 y.o. (DOB [birth year]) female.  Chief Complaint:  Chief Complaint  Patient presents with   Follow-up    H/o anxiety and low energy    HPI Rebekah Higgins presents to the office today for follow-up of anxiety and insomnia. "Everything is going pretty good." She reports that if she forgets to take morning medication she feels more "emotional." She reports that her energy and motivation have been good. She reports that anxiety has been manageable. She reports that she has been sleeping ok. Reports that she falls asleep in the living room and then goes upstairs. She reports that she is "feeling better" about her eating. Denies recent binge eating. She reports that Vyvanse is helpful for her concentration. She reports "I'm all over the place a lot." Denies SI.   She reports that her mother has COVID and she was told yesterday that healthcare providers think her mother will die. Father-in-law died in 02/24/2023. She reports that she and her husband have been getting along well.   Past Psychiatric Medication Trials: Nefazodone- Started in 2000.  Viibryd Vyvanse- Takes for ADD. Has been helpful for focus and starting her day. Temazepam- Has taken long-term. Initially on 15 mg po QHS,     Review of Systems:  Review of Systems  Musculoskeletal:  Negative for gait problem.  Neurological:  Negative for tremors and headaches.  Psychiatric/Behavioral:         Please refer to HPI   Medications: I have reviewed the patient's current medications.  Current Outpatient Medications  Medication Sig Dispense Refill   Romosozumab-aqqg (EVENITY) 105 MG/1.17ML SOSY injection Inject 210 mg into the skin once.     SUMAtriptan (IMITREX) 100 MG tablet Take by mouth.     amphetamine-dextroamphetamine (ADDERALL) 10 MG tablet Take by mouth.     Ascorbic Acid (VITAMIN C PO) Take by mouth.     buPROPion (WELLBUTRIN XL) 300  MG 24 hr tablet Take 1 tablet (300 mg total) by mouth daily. 90 tablet 1   CALCIUM PO Take by mouth.     cholecalciferol (VITAMIN D3) 25 MCG (1000 UNIT) tablet Take 1,000 Units by mouth daily.     famotidine (PEPCID) 20 MG tablet Take 20 mg by mouth daily as needed.     lisdexamfetamine (VYVANSE) 60 MG capsule Take 60 mg by mouth every morning.     Methylsulfonylmethane (MSM PO) Take by mouth.     Multiple Vitamin (MULTIVITAMIN) tablet Take by mouth.     Pediatric Multivitamins-Iron (FLINTSTONES COMPLETE PO) Take by mouth.     potassium chloride (KLOR-CON) 10 MEQ tablet Take 1 tablet by mouth daily.     pramipexole (MIRAPEX) 0.75 MG tablet Take 1 tablet (0.75 mg total) by mouth daily at 12 noon. 90 tablet 0   Probiotic Product (PROBIOTIC PO) Take by mouth.     promethazine (PHENERGAN) 25 MG tablet Take 25 mg by mouth every 6 (six) hours as needed.     temazepam (RESTORIL) 30 MG capsule Take 30 mg by mouth at bedtime as needed.     Vilazodone HCl (VIIBRYD) 40 MG TABS Take 1 tablet (40 mg total) by mouth daily. 90 tablet 1   No current facility-administered medications for this visit.    Medication Side Effects: None  Allergies: No Known Allergies  Past Medical History:  Diagnosis Date   Cancer ([place])    Hx of detached retina repair  Past Medical History, Surgical history, Social history, and Family history were reviewed and updated as appropriate.   Please see review of systems for further details on the patient's review from today.   Objective:   Physical Exam:  There were no vitals taken for this visit.  Physical Exam Constitutional:      General: She is not in acute distress. Musculoskeletal:        General: No deformity.  Neurological:     Mental Status: She is alert and oriented to person, place, and time.     Coordination: Coordination normal.  Psychiatric:        Attention and Perception: Attention and perception normal. She does not perceive auditory or visual  hallucinations.        Mood and Affect: Mood normal. Mood is not anxious or depressed. Affect is not labile, blunt, angry or inappropriate.        Speech: Speech normal.        Behavior: Behavior normal.        Thought Content: Thought content normal. Thought content is not paranoid or delusional. Thought content does not include homicidal or suicidal ideation. Thought content does not include homicidal or suicidal plan.        Cognition and Memory: Cognition and memory normal.        Judgment: Judgment normal.     Comments: Insight intact    Lab Review:  No results found for: NA, K, CL, CO2, GLUCOSE, BUN, CREATININE, CALCIUM, PROT, ALBUMIN, AST, ALT, ALKPHOS, BILITOT, GFRNONAA, GFRAA  No results found for: WBC, RBC, HGB, HCT, PLT, MCV, MCH, MCHC, RDW, LYMPHSABS, MONOABS, EOSABS, BASOSABS  No results found for: POCLITH, LITHIUM   No results found for: PHENYTOIN, PHENOBARB, VALPROATE, CBMZ   .res Assessment: Plan:   Pt seen for 30 minutes and time spent discussing treatment plan. Discussed that Viibryd will be available in a generic later this year and discussed her concerns about generic possibly not being as effective. Advised pt to contact office if this does occur to discuss options, ie, switching to brand name if she has an adverse reaction.  Will continue Viibryd 40 mg po qd for anxiety.  Continue Wellbutrin XL 300 mg po qd for energy.  Continue Pramipexole 0.75 mg daily for RLS and since pt had some decline in mood off Pramipexole.  Pt to follow-up in 3 months or sooner if clinically indicated.  Patient advised to contact office with any questions, adverse effects, or acute worsening in signs and symptoms.    Rebekah Higgins was seen today for follow-up.  Diagnoses and all orders for this visit:  Generalized anxiety disorder -     Vilazodone HCl (VIIBRYD) 40 MG TABS; Take 1 tablet (40 mg total) by mouth daily.  Restless legs syndrome (RLS) -     pramipexole (MIRAPEX) 0.75 MG tablet;  Take 1 tablet (0.75 mg total) by mouth daily at 12 noon.  Other orders -     buPROPion (WELLBUTRIN XL) 300 MG 24 hr tablet; Take 1 tablet (300 mg total) by mouth daily.    Please see After Visit Summary for patient specific instructions.  Future Appointments  Date Time Provider [place]  09/13/2021  3:30 PM Thayer Headings, PMHNP CP-CP None    No orders of the defined types were placed in this encounter.   -------------------------------

## 2021-08-10 IMAGING — DX DG ANKLE 2V *L*
1 series · 2 of 2 positions shown · non-contrast
Comparison: None.

CLINICAL DATA: Ankle deformity. The no history of prior ankle
injury.

EXAM:
LEFT ANKLE - 2 VIEW

[Series 1: ankle · 0.14mm/px · 2 of 2 slices shown]
[im 1/2]
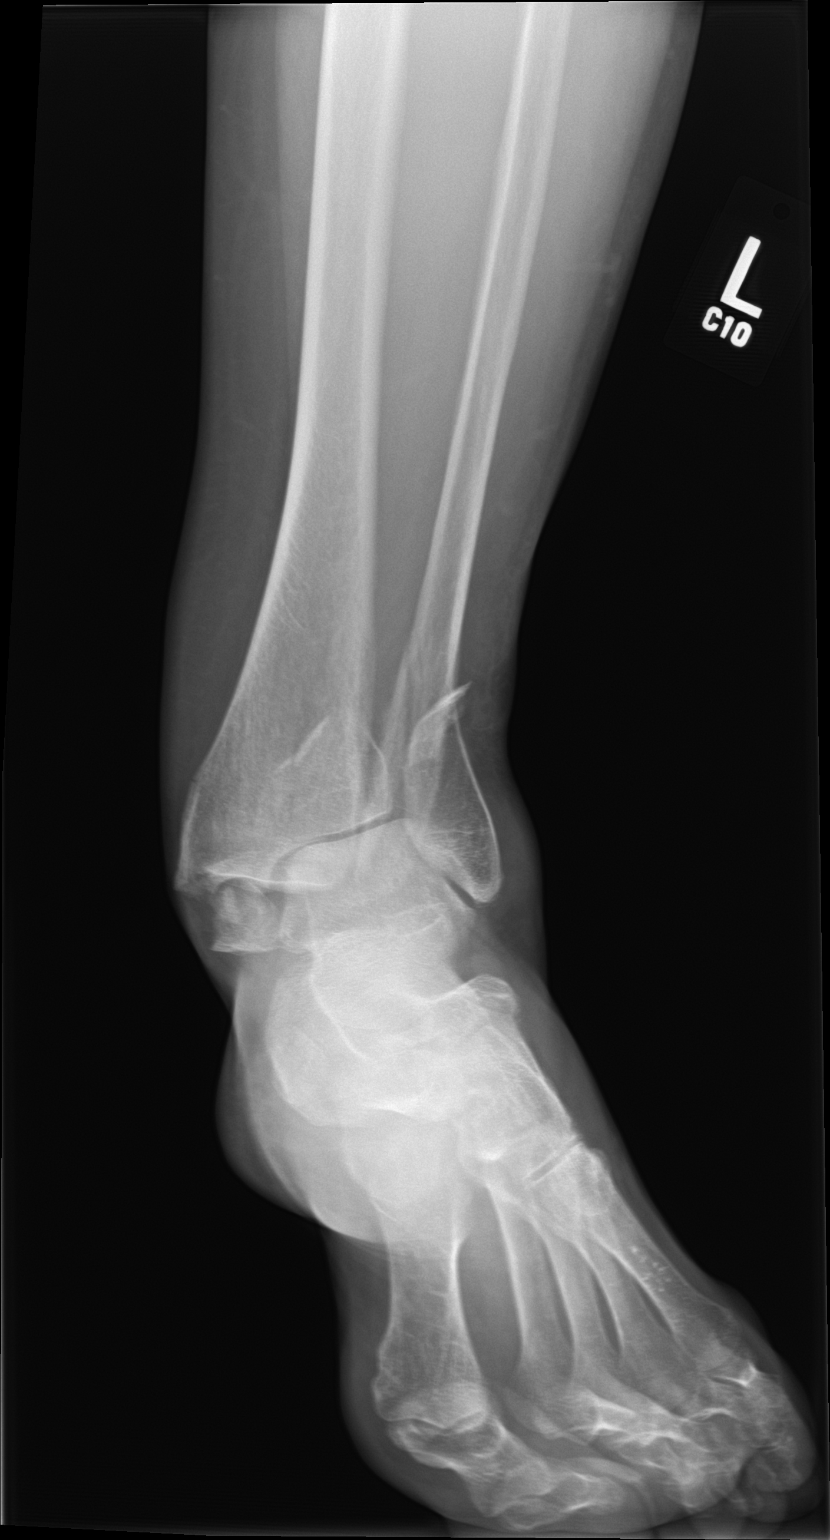
[im 2/2]
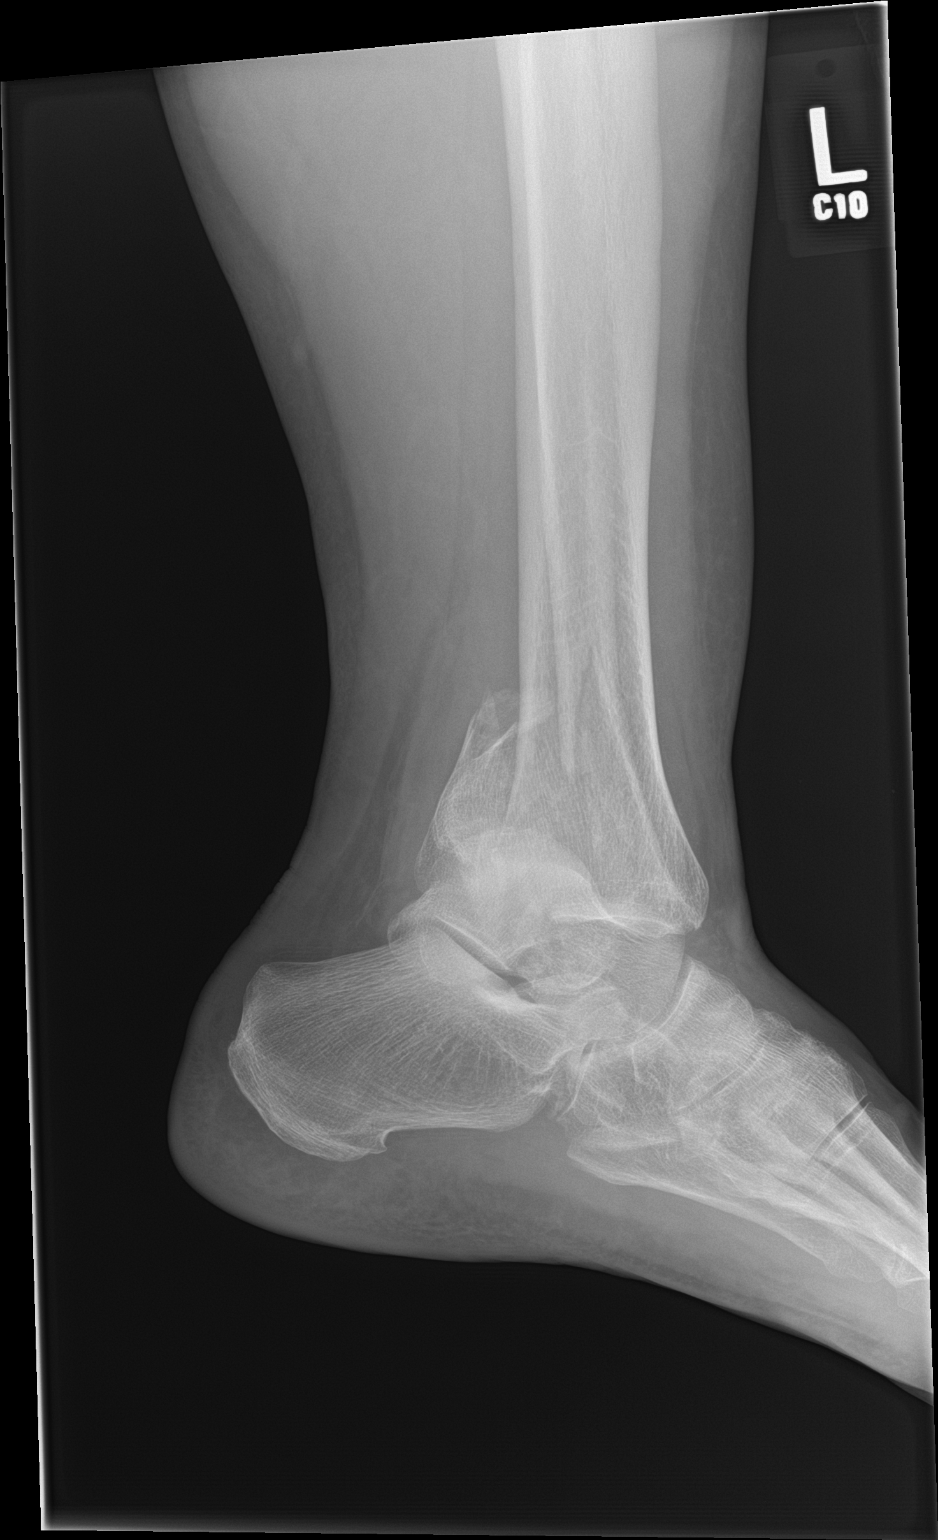

[2 of 2 positions shown; findings below may reference images not displayed]

FINDINGS: Signs of trimalleolar fracture with posterior and lateral
dislocation of the talus relative to the tibia. Displacement of the
lateral malleolus with over riding of the distal fracture fragment
and apex medial angulation of the fracture.

Displacement of the medial malleolus also with angulation, traveling
with the talus laterally.

Posterior tibial fracture with comminution and dislocation.
IMPRESSION: Trimalleolar fracture with posterior and lateral dislocation of the
talus relative to the tibia., associated with comminution of the
posterior tibia at the tibial plafond.

## 2021-09-13 ENCOUNTER — Encounter: Payer: Self-pay | Admitting: Psychiatry

## 2021-09-13 ENCOUNTER — Ambulatory Visit (INDEPENDENT_AMBULATORY_CARE_PROVIDER_SITE_OTHER): Payer: Medicare Other | Admitting: Psychiatry

## 2021-09-13 ENCOUNTER — Other Ambulatory Visit: Payer: Self-pay

## 2021-09-13 DIAGNOSIS — G2581 Restless legs syndrome: Secondary | ICD-10-CM | POA: Diagnosis not present

## 2021-09-13 DIAGNOSIS — F411 Generalized anxiety disorder: Secondary | ICD-10-CM

## 2021-09-13 MED ORDER — VILAZODONE HCL 40 MG PO TABS: 40.0000 mg | ORAL_TABLET | Freq: Every day | ORAL | 1 refills | Status: DC

## 2021-09-13 MED ORDER — PRAMIPEXOLE DIHYDROCHLORIDE 0.75 MG PO TABS: 0.7500 mg | ORAL_TABLET | Freq: Every evening | ORAL | 1 refills | Status: DC

## 2021-09-13 MED ORDER — BUPROPION HCL ER (XL) 300 MG PO TB24: 300.0000 mg | ORAL_TABLET | Freq: Every day | ORAL | 1 refills | Status: DC

## 2021-09-13 NOTE — Progress Notes (Signed)
Rebekah Higgins [id number] December 06, 1956 65 y.o.  Subjective:   Patient ID:  Rebekah Higgins is a 65 y.o. (DOB 18-Oct-1956) female.  Chief Complaint:  Chief Complaint  Patient presents with   Follow-up    Anxiety     HPI Rebekah Higgins presents to the office today for follow-up of anxiety. "I"m doing pretty good." She denies any recent anxiety. She reports that she is lethargic on occasion and not interested in things. She reports that her motivation is good and does things she needs to do. Denies any physical s/s with anxiety. She reports, "I am generally happy unless" someone makes a comment that "hurts my feelings." She reports that she has been sleeping well. She reports that she stays up later and will watch tv since that is some of the only time she has alone. She reports getting an adequate amount of sleep. She reports that her energy has been good. Appetite has been good. She reports that she has "been more aware of not wanting to splurge." She reports that her motivation has been low for walking and exercise. She reports adequate concentration with Vyvanse. Denies SI.   Mother died 06-24-2021. She reports that "it was a relief" due to mother having prolonged illness. She has been trying to take care of mother's legal affairs. She reports missing talking to her mother.   She is enjoying tutoring.   Enjoyed vacation.   Taking Temazepam most nights.   Past Psychiatric Medication Trials: Nefazodone- Started in 2000.  Viibryd Vyvanse- Takes for ADD. Has been helpful for focus and starting her day. Temazepam- Has taken long-term. Initially on 15 mg po QHS,    Review of Systems:  Review of Systems  Musculoskeletal:  Negative for gait problem.  Neurological:        Restless legs  Psychiatric/Behavioral:         Please refer to HPI   Has RLS in the evening.   Medications: I have reviewed the patient's current medications.  Current Outpatient Medications  Medication Sig Dispense  Refill   amphetamine-dextroamphetamine (ADDERALL) 10 MG tablet Take by mouth.     Ascorbic Acid (VITAMIN C PO) Take by mouth.     CALCIUM PO Take by mouth.     cholecalciferol (VITAMIN D3) 25 MCG (1000 UNIT) tablet Take 1,000 Units by mouth daily.     denosumab (PROLIA) 60 MG/ML SOSY injection Inject 60 mg into the skin every 6 (six) months.     famotidine (PEPCID) 20 MG tablet Take 20 mg by mouth daily as needed.     lisdexamfetamine (VYVANSE) 60 MG capsule Take 60 mg by mouth every morning.     Multiple Vitamin (MULTIVITAMIN) tablet Take by mouth.     Pediatric Multivitamins-Iron (FLINTSTONES COMPLETE PO) Take by mouth.     potassium chloride (KLOR-CON) 10 MEQ tablet Take 1 tablet by mouth daily.     SUMAtriptan (IMITREX) 100 MG tablet Take by mouth.     temazepam (RESTORIL) 30 MG capsule Take 30 mg by mouth at bedtime as needed.     buPROPion (WELLBUTRIN XL) 300 MG 24 hr tablet Take 1 tablet (300 mg total) by mouth daily. 90 tablet 1   pramipexole (MIRAPEX) 0.75 MG tablet Take 1 tablet (0.75 mg total) by mouth every evening. 90 tablet 1   Probiotic Product (PROBIOTIC PO) Take by mouth.     promethazine (PHENERGAN) 25 MG tablet Take 25 mg by mouth every 6 (six) hours as needed.  Romosozumab-aqqg (EVENITY) 105 MG/1.17ML SOSY injection Inject 210 mg into the skin once. (Patient not taking: Reported on 09/13/2021)     Vilazodone HCl (VIIBRYD) 40 MG TABS Take 1 tablet (40 mg total) by mouth daily. 90 tablet 1   No current facility-administered medications for this visit.    Medication Side Effects: None  Allergies: No Known Allergies  Past Medical History:  Diagnosis Date   Cancer ([place])    Hx of detached retina repair     Past Medical History, Surgical history, Social history, and Family history were reviewed and updated as appropriate.   Please see review of systems for further details on the patient's review from today.   Objective:   Physical Exam:  There were no vitals  taken for this visit.  Physical Exam Constitutional:      General: She is not in acute distress. Musculoskeletal:        General: No deformity.  Neurological:     Mental Status: She is alert and oriented to person, place, and time.     Coordination: Coordination normal.  Psychiatric:        Attention and Perception: Attention and perception normal. She does not perceive auditory or visual hallucinations.        Mood and Affect: Mood normal. Mood is not anxious or depressed. Affect is not labile, blunt, angry or inappropriate.        Speech: Speech normal.        Behavior: Behavior normal.        Thought Content: Thought content normal. Thought content is not paranoid or delusional. Thought content does not include homicidal or suicidal ideation. Thought content does not include homicidal or suicidal plan.        Cognition and Memory: Cognition and memory normal.        Judgment: Judgment normal.     Comments: Insight intact    Lab Review:  No results found for: NA, K, CL, CO2, GLUCOSE, BUN, CREATININE, CALCIUM, PROT, ALBUMIN, AST, ALT, ALKPHOS, BILITOT, GFRNONAA, GFRAA  No results found for: WBC, RBC, HGB, HCT, PLT, MCV, MCH, MCHC, RDW, LYMPHSABS, MONOABS, EOSABS, BASOSABS  No results found for: POCLITH, LITHIUM   No results found for: PHENYTOIN, PHENOBARB, VALPROATE, CBMZ   .res Assessment: Plan:   Pt seen for 30 minutes and time spent discussing treatment to include answering question about how long medication typically remains effective. Recommended taking Pramipexole about 2-3 hours before bedtime to improve RLS since she reports that she is currently taking it in the morning and experiencing RLS at night. Discussed that Viibryd is now generic and next prescription fill will likely be for generic.  Continue Viibryd 40 mg po qd for anxiety. Continue Wellbutrin XL 150 mg po qd since she reports that this has been helpful for her energy, motivation, and food cravings.  Vyvanse,  Adderall, and Temazepam are managed by PCP.  Pt to follow-up with this provider in 4 months or sooner if clinically indicated.  Patient advised to contact office with any questions, adverse effects, or acute worsening in signs and symptoms.  Ariyana was seen today for follow-up.  Diagnoses and all orders for this visit:  Restless legs syndrome (RLS) -     pramipexole (MIRAPEX) 0.75 MG tablet; Take 1 tablet (0.75 mg total) by mouth every evening.  Generalized anxiety disorder -     Vilazodone HCl (VIIBRYD) 40 MG TABS; Take 1 tablet (40 mg total) by mouth daily.  Other orders -  buPROPion (WELLBUTRIN XL) 300 MG 24 hr tablet; Take 1 tablet (300 mg total) by mouth daily.    Please see After Visit Summary for patient specific instructions.  Future Appointments  Date Time Provider [place]  01/17/2022  3:30 PM Thayer Headings, PMHNP CP-[facility] None    No orders of the defined types were placed in this encounter.   -------------------------------

## 2022-01-17 ENCOUNTER — Ambulatory Visit: Payer: Medicare Other | Admitting: Psychiatry

## 2022-02-02 ENCOUNTER — Other Ambulatory Visit: Payer: Self-pay

## 2022-02-02 ENCOUNTER — Encounter: Payer: Self-pay | Admitting: Psychiatry

## 2022-02-02 ENCOUNTER — Ambulatory Visit (INDEPENDENT_AMBULATORY_CARE_PROVIDER_SITE_OTHER): Payer: Medicare Other | Admitting: Psychiatry

## 2022-02-02 DIAGNOSIS — F411 Generalized anxiety disorder: Secondary | ICD-10-CM

## 2022-02-02 MED ORDER — NEFAZODONE HCL 200 MG PO TABS: ORAL_TABLET | ORAL | 0 refills | Status: DC

## 2022-02-02 MED ORDER — BUPROPION HCL ER (XL) 300 MG PO TB24: 300.0000 mg | ORAL_TABLET | Freq: Every day | ORAL | 1 refills | Status: DC

## 2022-02-02 MED ORDER — VIIBRYD STARTER PACK 10 & 20 MG PO KIT: PACK | ORAL | 0 refills | Status: DC

## 2022-02-02 NOTE — Progress Notes (Signed)
Rebekah Higgins [id number] 12-26-1955 66 y.o.  Subjective:   Patient ID:  Rebekah Higgins is a 66 y.o. (DOB 05-12-1956) female.  Chief Complaint:  Chief Complaint  Patient presents with   Anxiety   Depression    HPI Rebekah Higgins presents to the office today for follow-up of anxiety.  She reports that Viibryd no longer seems to be as effective as it once was and that signs and symptoms have not been as well controlled since she stopped nefazodone when it was no longer available.  She reports that she now thinks that medication is no longer as effective. She reports that she would be content to stay in her bathrobe all day and stay at home, but does not give into this and pushes herself to do things. 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 Denies low energy. She reports that she has gained 20 lbs since her injury. She reports occ binge eating and that this has been better compared to the past. She craves sugar. "Every once and awhile I feel like crying for no particular reason, but things are great" and children are doing well. She reports periods of sadness. Occ crying. She reports that eating something that tastes good is what she enjoys most. She reports that she has long-standing worry. She has had difficulty watching the news recently due to anxiety. She reports some rumination. She listens to something on the radio or TV that distracts her at night so she can fall asleep. Will start dozing at night in her chair and later goes to bed. Denies physical s/s with anxiety. She reports that her husband has noticed she has irritability. She reports, "I am quick to have my feelings hurt." She reports long-standing procrastination. Concentration is adequate with medication.   Occ passive death wishes. Denies SI.   She plans to start a healthy eating plan on Monday. Used to walk 4 miles a day and has no longer wanted to. Has PT every 2 weeks and goes twice a week on her own.   She reports that she  and her husband plan to see a couples therapist.   Past Psychiatric Medication Trials: Nefazodone- Started in 2000.  Viibryd Vyvanse- Takes for ADD. Has been helpful for focus and starting her day. Temazepam- Has taken long-term. Initially on 15 mg po QHS,      Review of Systems:  Review of Systems  Respiratory:  Positive for shortness of breath.   Musculoskeletal:  Negative for gait problem.  Psychiatric/Behavioral:         Please refer to HPI  Has been having some RLS at night.    Medications: I have reviewed the patient's current medications.  Current Outpatient Medications  Medication Sig Dispense Refill   amphetamine-dextroamphetamine (ADDERALL) 10 MG tablet Take by mouth.     Ascorbic Acid (VITAMIN C PO) Take by mouth.     CALCIUM PO Take by mouth.     cholecalciferol (VITAMIN D3) 25 MCG (1000 UNIT) tablet Take 1,000 Units by mouth daily.     famotidine (PEPCID) 20 MG tablet Take 20 mg by mouth daily as needed.     lisdexamfetamine (VYVANSE) 60 MG capsule Take 60 mg by mouth every morning.     Multiple Vitamin (MULTIVITAMIN) tablet Take by mouth.     nefazodone (SERZONE) 200 MG tablet Take 1/2 tab at bedtime for one week, then increase to 1 tab at bedtime for one week, then 1.5 tabs at bedtime 45 tablet 0  Pediatric Multivitamins-Iron (FLINTSTONES COMPLETE PO) Take by mouth.     potassium chloride (KLOR-CON) 10 MEQ tablet Take 1 tablet by mouth daily.     Probiotic Product (PROBIOTIC PO) Take by mouth.     promethazine (PHENERGAN) 25 MG tablet Take 25 mg by mouth every 6 (six) hours as needed.     SUMAtriptan (IMITREX) 100 MG tablet Take by mouth.     temazepam (RESTORIL) 30 MG capsule Take 30 mg by mouth at bedtime as needed.     Vilazodone HCl (VIIBRYD STARTER PACK) 10 & 20 MG KIT Take 30 mg daily for one week, then 20 mg daily for one week, then 10 mg daily for one week, then stop 2 kit 0   buPROPion (WELLBUTRIN XL) 300 MG 24 hr tablet Take 1 tablet (300 mg total) by  mouth daily. 90 tablet 1   denosumab (PROLIA) 60 MG/ML SOSY injection Inject 60 mg into the skin every 6 (six) months. (Patient not taking: Reported on 02/02/2022)     pramipexole (MIRAPEX) 0.75 MG tablet Take 1 tablet (0.75 mg total) by mouth every evening. 90 tablet 1   Romosozumab-aqqg (EVENITY) 105 MG/1.17ML SOSY injection Inject 210 mg into the skin once. (Patient not taking: Reported on 09/13/2021)     No current facility-administered medications for this visit.    Medication Side Effects: None  Allergies: No Known Allergies  Past Medical History:  Diagnosis Date   Cancer ([place])    Hx of detached retina repair     Past Medical History, Surgical history, Social history, and Family history were reviewed and updated as appropriate.   Please see review of systems for further details on the patient's review from today.   Objective:   Physical Exam:  There were no vitals taken for this visit.  Physical Exam Constitutional:      General: She is not in acute distress. Musculoskeletal:        General: No deformity.  Neurological:     Mental Status: She is alert and oriented to person, place, and time.     Coordination: Coordination normal.  Psychiatric:        Attention and Perception: Attention and perception normal. She does not perceive auditory or visual hallucinations.        Mood and Affect: Mood is anxious and depressed. Affect is not labile, blunt, angry or inappropriate.        Speech: Speech normal.        Behavior: Behavior normal.        Thought Content: Thought content normal. Thought content is not paranoid or delusional. Thought content does not include homicidal or suicidal ideation. Thought content does not include homicidal or suicidal plan.        Cognition and Memory: Cognition and memory normal.        Judgment: Judgment normal.     Comments: Insight intact    Lab Review:  No results found for: NA, K, CL, CO2, GLUCOSE, BUN, CREATININE, CALCIUM, PROT,  ALBUMIN, AST, ALT, ALKPHOS, BILITOT, GFRNONAA, GFRAA  No results found for: WBC, RBC, HGB, HCT, PLT, MCV, MCH, MCHC, RDW, LYMPHSABS, MONOABS, EOSABS, BASOSABS  No results found for: POCLITH, LITHIUM   No results found for: PHENYTOIN, PHENOBARB, VALPROATE, CBMZ   .res Assessment: Plan:    Patient seen for 30 minutes and time spent discussing past treatment with nefazodone and attempting to determine if nefazodone is available.  Provider contacted [facility] during exam to determine availability and was  told that they had several strengths in stock.  Discussed option of transitioning back to nefazodone with patient and she reports that she would like to resume nefazodone.  Reviewed potential benefits, risks, and side effects of nefazodone with patient. Case staffed with Dr. Clovis Pu to discuss cross titration from Viibryd to nefazodone. Will decrease Viibryd from 40 mg daily to 30 mg daily for 1 week, then decrease to 20 mg daily for 1 week, then 10 mg daily for 1 week, then stop. Will start nefazodone 100 mg at bedtime for 1 week, then increase to 200 mg at bedtime for 1 week, then increase to 300 mg at bedtime for mood and anxiety symptoms. Patient reports that she would like to try to obtain nefazodone from her usual pharmacy or another local pharmacy first, and then will consider having prescription filled at [facility] if she has difficulty locating it elsewhere.  She reports that she will contact office if she needs prescription to be transferred. Continue Wellbutrin XL 300 mg daily for mood, energy, motivation, and concentration. Patient to follow-up in 4 weeks or sooner if clinically indicated. Patient advised to contact office with any questions, adverse effects, or acute worsening in signs and symptoms.    Rebekah Higgins was seen today for anxiety and depression.  Diagnoses and all orders for this visit:  Generalized anxiety disorder -     Vilazodone HCl (VIIBRYD STARTER  PACK) 10 & 20 MG KIT; Take 30 mg daily for one week, then 20 mg daily for one week, then 10 mg daily for one week, then stop -     nefazodone (SERZONE) 200 MG tablet; Take 1/2 tab at bedtime for one week, then increase to 1 tab at bedtime for one week, then 1.5 tabs at bedtime  Other orders -     buPROPion (WELLBUTRIN XL) 300 MG 24 hr tablet; Take 1 tablet (300 mg total) by mouth daily.     Please see After Visit Summary for patient specific instructions.  Future Appointments  Date Time Provider [place]  02/27/2022  1:45 PM Thayer Headings, PMHNP CP-CP None    No orders of the defined types were placed in this encounter.   -------------------------------

## 2022-02-02 NOTE — Patient Instructions (Addendum)
Start Nefazodone 200 mg 1/2 tab at bedtime for one week, then increase to 1 tab at bedtime for one week, then 1.5 tabs at bedtime.  Decrease Viibryd to 30 mg daily for one week (when starting Nefazodone), then 20 mg daily for one week, then 10 mg daily for one week, then stop.

## 2022-02-25 ENCOUNTER — Other Ambulatory Visit: Payer: Self-pay | Admitting: Psychiatry

## 2022-02-25 DIAGNOSIS — F411 Generalized anxiety disorder: Secondary | ICD-10-CM

## 2022-02-27 ENCOUNTER — Encounter: Payer: Self-pay | Admitting: Psychiatry

## 2022-02-27 ENCOUNTER — Other Ambulatory Visit: Payer: Self-pay

## 2022-02-27 ENCOUNTER — Ambulatory Visit (INDEPENDENT_AMBULATORY_CARE_PROVIDER_SITE_OTHER): Payer: Medicare Other | Admitting: Psychiatry

## 2022-02-27 DIAGNOSIS — F411 Generalized anxiety disorder: Secondary | ICD-10-CM

## 2022-02-27 MED ORDER — NEFAZODONE HCL 200 MG PO TABS: 300.0000 mg | ORAL_TABLET | Freq: Every day | ORAL | 1 refills | Status: DC

## 2022-02-27 NOTE — Progress Notes (Signed)
[name] ?[id number] ?11-18-1956 ?66 y.o. ? ?Subjective:  ? ?Patient ID:  Rebekah Higgins is a 66 y.o. (DOB 1956/04/15) female. ? ?Chief Complaint:  ?Chief Complaint  ?Patient presents with  ? Follow-up  ?  Anxiety  ? ? ?HPI ?[name] presents to the office today for follow-up of anxiety. She reports that she had worsening fatigue and shortness of breath. She reports that she went to her PCP and was told that she had iron deficiency anemia. She has received 2 iron infusions and is having medical work-up to rule out cause of internal bleeding. She reports that she now feels some better.  ? ?She reports that she has titrated off Viibryd and is now on Nefazodone. She reports that she is "not worse." She reports, "I feel sorry for myself a little bit." Does not recall crying recently for no particular reason. She reports that her energy and motivation has been low. She denies anxiety or excessive worry. She reports that she would like to lose weight. Appetite has been ok. She has been decreasing sugar intake and has started a new diet plan. Denies any recent binge eating. Sleeping ok. Concentration has been ok with Vyvanse. Denies SI.  ? 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 ? ?Close with both of her children. ? ?Past Psychiatric Medication Trials: ?Nefazodone ?Viibryd ?Vyvanse- Takes for ADD. Has been helpful for focus and starting her day. ?Temazepam- Has taken long-term. Initially on 15 mg po QHS,   ?  ?  ?Review of Systems:  ?Review of Systems  ?Respiratory:    ?     Improved shortness of breath  ?Gastrointestinal:   ?     She started Protonix for reflux  ?Musculoskeletal:  Negative for gait problem.  ?Neurological:  Negative for tremors.  ?Psychiatric/Behavioral:    ?     Please refer to HPI  ? ?Medications: I have reviewed the patient's current medications. ? ?Current Outpatient Medications  ?Medication Sig Dispense Refill  ? amphetamine-dextroamphetamine (ADDERALL) 10 MG tablet Take by  mouth.    ? Ascorbic Acid (VITAMIN C PO) Take by mouth.    ? buPROPion (WELLBUTRIN XL) 300 MG 24 hr tablet Take 1 tablet (300 mg total) by mouth daily. 90 tablet 1  ? CALCIUM PO Take by mouth.    ? cholecalciferol (VITAMIN D3) 25 MCG (1000 UNIT) tablet Take 1,000 Units by mouth daily.    ? famotidine (PEPCID) 20 MG tablet Take 20 mg by mouth daily as needed.    ? lisdexamfetamine (VYVANSE) 60 MG capsule Take 60 mg by mouth every morning.    ? Multiple Vitamin (MULTIVITAMIN) tablet Take by mouth.    ? pantoprazole (PROTONIX) 40 MG tablet Take 40 mg by mouth every morning.    ? potassium chloride (KLOR-CON) 10 MEQ tablet Take 1 tablet by mouth daily.    ? Probiotic Product (PROBIOTIC PO) Take by mouth.    ? promethazine (PHENERGAN) 25 MG tablet Take 25 mg by mouth every 6 (six) hours as needed.    ? SUMAtriptan (IMITREX) 100 MG tablet Take by mouth.    ? temazepam (RESTORIL) 30 MG capsule Take 30 mg by mouth at bedtime as needed.    ? denosumab (PROLIA) 60 MG/ML SOSY injection Inject 60 mg into the skin every 6 (six) months. (Patient not taking: Reported on 02/02/2022)    ? nefazodone (SERZONE) 200 MG tablet Take 1.5 tablets (300 mg total) by mouth at bedtime. 45 tablet 1  ?  pramipexole (MIRAPEX) 0.75 MG tablet Take 1 tablet (0.75 mg total) by mouth every evening. 90 tablet 1  ? Romosozumab-aqqg (EVENITY) 105 MG/1.17ML SOSY injection Inject 210 mg into the skin once. (Patient not taking: Reported on 09/13/2021)    ? ?No current facility-administered medications for this visit.  ? ? ?Medication Side Effects: None ? ?Allergies: No Known Allergies ? ?Past Medical History:  ?Diagnosis Date  ? Cancer [facility])   ? Hx of detached retina repair   ? ? ?Past Medical History, Surgical history, Social history, and Family history were reviewed and updated as appropriate.  ? ?Please see review of systems for further details on the patient's review from today.  ? ?Objective:  ? ?Physical Exam:  ?There were no vitals taken for this  visit. ? ?Physical Exam ?Constitutional:   ?   General: She is not in acute distress. ?Musculoskeletal:     ?   General: No deformity.  ?Neurological:  ?   Mental Status: She is alert and oriented to person, place, and time.  ?   Coordination: Coordination normal.  ?Psychiatric:     ?   Attention and Perception: Attention and perception normal. She does not perceive auditory or visual hallucinations.     ?   Mood and Affect: Affect is not labile, blunt, angry or inappropriate.     ?   Speech: Speech normal.     ?   Behavior: Behavior normal.     ?   Thought Content: Thought content normal. Thought content is not paranoid or delusional. Thought content does not include homicidal or suicidal ideation. Thought content does not include homicidal or suicidal plan.     ?   Cognition and Memory: Cognition and memory normal.     ?   Judgment: Judgment normal.  ?   Comments: Insight intact ?Affect is brighter and mood is more positive today compared to last exam.   ? ? ?Lab Review:  ?No results found for: NA, K, CL, CO2, GLUCOSE, BUN, CREATININE, CALCIUM, PROT, ALBUMIN, AST, ALT, ALKPHOS, BILITOT, GFRNONAA, GFRAA ? ?No results found for: WBC, RBC, HGB, HCT, PLT, MCV, MCH, MCHC, RDW, LYMPHSABS, MONOABS, EOSABS, BASOSABS ? ?No results found for: POCLITH, LITHIUM  ? ?No results found for: PHENYTOIN, PHENOBARB, VALPROATE, CBMZ  ? ?.res ?Assessment: Plan:   ? ?Pt seen for 30 minutes and time spent discussing response to Nefazodone. She reports that she has tolerated cross-titration without difficulty. Discussed that it is difficult to fully assess response to change in medication since she has been diagnosed with anemia and received iron infusions, which can affect energy and motivation. Plan is to continue Nefazodone 300 mg at bedtime at this time and re-assess in early May after blood levels are re-checked for anemia. Discussed considering increase in Nefazodone at that time if blood levels are WNL and she continues to have low  energy. Discussed that anemia may be the most likely cause of fatigue, particularly considering the absence of other depressive s/s.  ?Patient advised to contact office with any questions, adverse effects, or acute worsening in signs and symptoms. ? ? ?Nalini was seen today for follow-up. ? ?Diagnoses and all orders for this visit: ? ?Generalized anxiety disorder ?-     nefazodone (SERZONE) 200 MG tablet; Take 1.5 tablets (300 mg total) by mouth at bedtime. ? ?  ? ?Please see After Visit Summary for patient specific instructions. ? ?No future appointments. ? ?No orders of the defined types were placed in this  encounter. ? ? ?------------------------------- ?

## 2022-02-27 NOTE — Telephone Encounter (Signed)
Has appt with Jessica today.  

## 2022-03-24 ENCOUNTER — Other Ambulatory Visit: Payer: Self-pay | Admitting: Psychiatry

## 2022-03-24 DIAGNOSIS — F411 Generalized anxiety disorder: Secondary | ICD-10-CM

## 2022-03-25 ENCOUNTER — Other Ambulatory Visit: Payer: Self-pay | Admitting: Psychiatry

## 2022-03-25 DIAGNOSIS — G2581 Restless legs syndrome: Secondary | ICD-10-CM

## 2022-04-19 ENCOUNTER — Encounter: Payer: Self-pay | Admitting: Psychiatry

## 2022-04-19 ENCOUNTER — Ambulatory Visit (INDEPENDENT_AMBULATORY_CARE_PROVIDER_SITE_OTHER): Payer: Medicare Other | Admitting: Psychiatry

## 2022-04-19 DIAGNOSIS — F411 Generalized anxiety disorder: Secondary | ICD-10-CM | POA: Diagnosis not present

## 2022-04-19 DIAGNOSIS — G2581 Restless legs syndrome: Secondary | ICD-10-CM

## 2022-04-19 DIAGNOSIS — F32A Depression, unspecified: Secondary | ICD-10-CM

## 2022-04-19 MED ORDER — PRAMIPEXOLE DIHYDROCHLORIDE 0.75 MG PO TABS: 0.7500 mg | ORAL_TABLET | Freq: Every evening | ORAL | 0 refills | Status: DC

## 2022-04-19 MED ORDER — BUPROPION HCL ER (XL) 300 MG PO TB24: 300.0000 mg | ORAL_TABLET | Freq: Every day | ORAL | 0 refills | Status: DC

## 2022-04-19 MED ORDER — NEFAZODONE HCL 200 MG PO TABS: 200.0000 mg | ORAL_TABLET | Freq: Two times a day (BID) | ORAL | 2 refills | Status: DC

## 2022-04-19 NOTE — Progress Notes (Signed)
Rebekah Higgins ?[id number] ?07-03-1956 ?66 y.o. ? ?Subjective:  ? ?Patient ID:  Rebekah Higgins is a 66 y.o. (DOB [birth year]) female. ? ?Chief Complaint:  ?Chief Complaint  ?Patient presents with  ? Depression  ? ? ?HPI ?Rebekah Higgins presents to the office today for follow-up of anxiety and depression. She has had 2 iron infusions for anemia. She reports that she had follow-up blood work and was told she will need 2 more iron infusions. She reports, "I have more energy than I did, but I am not back to myself." She reports that her motivation is lower. She reports that she has been withdrawn and may see her friends calling and decide not to answer. She reports sad mood. She reports that she does the things that she has to do. She does chores that are needed and postpones other things. Denies anxiety. "Right now I am too tired to worry about stuff." Sleeping well. She reports that she stays up later (until 2-2:30 am) and makes herself get up at 10 am and could stay in bed later. She had had intentional weight loss and this has improved her mood. Concentration is adequate with Vyvanse.  ? 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 ? ?She reports that her children are doing well. Son graduates from [employer] program at [facility] soon. Son will be moving to [place].  ? ?Past Psychiatric Medication Trials: ?Nefazodone ?Viibryd ?Vyvanse- Takes for ADD. Has been helpful for focus and starting her day. ?Temazepam- Has taken long-term. Initially on 15 mg po QHS,   ?   ? ?Review of Systems:  ?Review of Systems  ?Constitutional:  Positive for fatigue.  ?Musculoskeletal:  Negative for gait problem.  ?Neurological:  Negative for tremors.  ?Psychiatric/Behavioral:    ?     Please refer to HPI  ? ?Medications: I have reviewed the patient's current medications. ? ?Current Outpatient Medications  ?Medication Sig Dispense Refill  ? amphetamine-dextroamphetamine (ADDERALL) 10 MG tablet Take by mouth.    ? Ascorbic Acid (VITAMIN C PO)  Take by mouth.    ? CALCIUM PO Take by mouth.    ? cholecalciferol (VITAMIN D3) 25 MCG (1000 UNIT) tablet Take 1,000 Units by mouth daily.    ? famotidine (PEPCID) 20 MG tablet Take 20 mg by mouth daily as needed.    ? lisdexamfetamine (VYVANSE) 60 MG capsule Take 60 mg by mouth every morning.    ? Multiple Vitamin (MULTIVITAMIN) tablet Take by mouth.    ? pantoprazole (PROTONIX) 40 MG tablet Take 40 mg by mouth every morning.    ? potassium chloride (KLOR-CON) 10 MEQ tablet Take 1 tablet by mouth daily.    ? Probiotic Product (PROBIOTIC PO) Take by mouth.    ? promethazine (PHENERGAN) 25 MG tablet Take 25 mg by mouth every 6 (six) hours as needed.    ? SUMAtriptan (IMITREX) 100 MG tablet Take by mouth.    ? temazepam (RESTORIL) 30 MG capsule Take 30 mg by mouth at bedtime as needed.    ? buPROPion (WELLBUTRIN XL) 300 MG 24 hr tablet Take 1 tablet (300 mg total) by mouth daily. 90 tablet 0  ? denosumab (PROLIA) 60 MG/ML SOSY injection Inject 60 mg into the skin every 6 (six) months. (Patient not taking: Reported on 02/02/2022)    ? nefazodone (SERZONE) 200 MG tablet Take 1 tablet (200 mg total) by mouth 2 (two) times daily. 60 tablet 2  ? pramipexole (MIRAPEX) 0.75 MG tablet Take 1  tablet (0.75 mg total) by mouth every evening. 90 tablet 0  ? Romosozumab-aqqg (EVENITY) 105 MG/1.17ML SOSY injection Inject 210 mg into the skin once. (Patient not taking: Reported on 09/13/2021)    ? ?No current facility-administered medications for this visit.  ? ? ?Medication Side Effects: None ? ?Allergies: No Known Allergies ? ?Past Medical History:  ?Diagnosis Date  ? Cancer [facility])   ? Hx of detached retina repair   ? ? ?Past Medical History, Surgical history, Social history, and Family history were reviewed and updated as appropriate.  ? ?Please see review of systems for further details on the patient's review from today.  ? ?Objective:  ? ?Physical Exam:  ?There were no vitals taken for this visit. ? ?Physical Exam ?Constitutional:    ?   General: She is not in acute distress. ?Musculoskeletal:     ?   General: No deformity.  ?Neurological:  ?   Mental Status: She is alert and oriented to person, place, and time.  ?   Coordination: Coordination normal.  ?Psychiatric:     ?   Attention and Perception: Attention and perception normal. She does not perceive auditory or visual hallucinations.     ?   Mood and Affect: Mood is depressed. Mood is not anxious. Affect is not labile, blunt, angry or inappropriate.     ?   Speech: Speech normal.     ?   Behavior: Behavior normal.     ?   Thought Content: Thought content normal. Thought content is not paranoid or delusional. Thought content does not include homicidal or suicidal ideation. Thought content does not include homicidal or suicidal plan.     ?   Cognition and Memory: Cognition and memory normal.     ?   Judgment: Judgment normal.  ?   Comments: Insight intact  ? ? ?Lab Review:  ?No results found for: NA, K, CL, CO2, GLUCOSE, BUN, CREATININE, CALCIUM, PROT, ALBUMIN, AST, ALT, ALKPHOS, BILITOT, GFRNONAA, GFRAA ? ?No results found for: WBC, RBC, HGB, HCT, PLT, MCV, MCH, MCHC, RDW, LYMPHSABS, MONOABS, EOSABS, BASOSABS ? ?No results found for: POCLITH, LITHIUM  ? ?No results found for: PHENYTOIN, PHENOBARB, VALPROATE, CBMZ  ? ?.res ?Assessment: Plan:   ? ?Pt seen for 30 minutes and time spent discussing response to iron infusions and continued low mood and energy. Discussed potential benefits, risks, and side effects of increasing Nefazodone. Pt is in agreement with increase in dose. Will increase Nefazodone from 300 mg po QHS to 200 mg twice daily for mood and anxiety.  ?Continue Wellbutrin XL 150 mg po qd for depression and energy.  ?Continue Pramipexole 0.75 mg po q evening for Restless legs.  ?Pt to follow-up in 6 weeks after follow-up with hematology/oncology. Pt to follow-up sooner if clinically indicated.  ?Patient advised to contact office with any questions, adverse effects, or acute  worsening in signs and symptoms. ? ? ?Rebekah Higgins was seen today for depression. ? ?Diagnoses and all orders for this visit: ? ?Depression, unspecified depression type ?-     buPROPion (WELLBUTRIN XL) 300 MG 24 hr tablet; Take 1 tablet (300 mg total) by mouth daily. ?-     nefazodone (SERZONE) 200 MG tablet; Take 1 tablet (200 mg total) by mouth 2 (two) times daily. ? ?Generalized anxiety disorder ?-     nefazodone (SERZONE) 200 MG tablet; Take 1 tablet (200 mg total) by mouth 2 (two) times daily. ? ?Restless legs syndrome (RLS) ?-  pramipexole (MIRAPEX) 0.75 MG tablet; Take 1 tablet (0.75 mg total) by mouth every evening. ? ?  ? ?Please see After Visit Summary for patient specific instructions. ? ?Future Appointments  ?Date Time Provider [place]  ?06/01/2022  1:00 PM Thayer Headings, PMHNP CP-CP None  ? ? ?No orders of the defined types were placed in this encounter. ? ? ?------------------------------- ?

## 2022-04-29 ENCOUNTER — Other Ambulatory Visit: Payer: Self-pay | Admitting: Psychiatry

## 2022-04-29 DIAGNOSIS — F411 Generalized anxiety disorder: Secondary | ICD-10-CM

## 2022-04-29 DIAGNOSIS — F32A Depression, unspecified: Secondary | ICD-10-CM

## 2022-05-03 ENCOUNTER — Other Ambulatory Visit: Payer: Self-pay | Admitting: Psychiatry

## 2022-05-03 DIAGNOSIS — F32A Depression, unspecified: Secondary | ICD-10-CM

## 2022-05-03 DIAGNOSIS — F411 Generalized anxiety disorder: Secondary | ICD-10-CM

## 2022-05-13 ENCOUNTER — Other Ambulatory Visit: Payer: Self-pay | Admitting: Psychiatry

## 2022-05-13 DIAGNOSIS — F411 Generalized anxiety disorder: Secondary | ICD-10-CM

## 2022-05-13 DIAGNOSIS — F32A Depression, unspecified: Secondary | ICD-10-CM

## 2022-06-01 ENCOUNTER — Ambulatory Visit (INDEPENDENT_AMBULATORY_CARE_PROVIDER_SITE_OTHER): Payer: Medicare Other | Admitting: Psychiatry

## 2022-06-01 ENCOUNTER — Encounter: Payer: Self-pay | Admitting: Psychiatry

## 2022-06-01 DIAGNOSIS — F411 Generalized anxiety disorder: Secondary | ICD-10-CM

## 2022-06-01 DIAGNOSIS — F32A Depression, unspecified: Secondary | ICD-10-CM

## 2022-06-01 DIAGNOSIS — G2581 Restless legs syndrome: Secondary | ICD-10-CM

## 2022-06-01 MED ORDER — PRAMIPEXOLE DIHYDROCHLORIDE 0.75 MG PO TABS: 0.7500 mg | ORAL_TABLET | Freq: Every evening | ORAL | 0 refills | Status: DC

## 2022-06-01 MED ORDER — BUPROPION HCL ER (XL) 300 MG PO TB24: 300.0000 mg | ORAL_TABLET | Freq: Every day | ORAL | 0 refills | Status: DC

## 2022-06-01 MED ORDER — NEFAZODONE HCL 200 MG PO TABS: 200.0000 mg | ORAL_TABLET | Freq: Two times a day (BID) | ORAL | 0 refills | Status: DC

## 2022-06-01 NOTE — Progress Notes (Incomplete)
Vertis Bauder [id number] February 17, 1956 66 y.o.  Subjective:   Patient ID:  Rebekah Higgins is a 66 y.o. (DOB October 05, 1956) female.  Chief Complaint:  Chief Complaint  Patient presents with  . Follow-up    Anxiety, depression    HPI Rebekah Higgins presents to the office today for follow-up of anxiety and depression. She reports that increase in Nefazodone has been helpful for her mood and anxiety. She reports that she will feel nauseous when she takes Nefazodone on an empty stomach in the morning. Denies sad mood. She has been trying to eat something when she takes it. She reports that she is less withdrawn. She has intentionally lost 20 lbs in 3 months. Going to sleep later and thinks this is because it is the only time she has alone. She reports that worry has been less. Concentration is adequate with medication. Denies SI.   She reports that anemia has improved. Energy has improved. She reports that she still feels somewhat sluggish. Motivation is lower than it used to be. She reports that she no longer feels short of breath when she goes up stairs.   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 She has decided to no longer continue in this position.   Son's graduation went well and he has moved to [place].    Past Psychiatric Medication Trials: Nefazodone Viibryd Vyvanse- Takes for ADD. Has been helpful for focus and starting her day. Temazepam- Has taken long-term. Initially on 15 mg po QHS    Review of Systems:  Review of Systems  Gastrointestinal:        Nausea upon awakening  Musculoskeletal:  Negative for gait problem.  Neurological:  Positive for headaches.  Psychiatric/Behavioral:         Please refer to HPI  Occ RLS  Medications: I have reviewed the patient's current medications.  Current Outpatient Medications  Medication Sig Dispense Refill  . pantoprazole (PROTONIX) 40 MG tablet Take 40 mg by mouth every morning.    Marland Kitchen  amphetamine-dextroamphetamine (ADDERALL) 10 MG tablet Take by mouth.    . Ascorbic Acid (VITAMIN C PO) Take by mouth.    Marland Kitchen buPROPion (WELLBUTRIN XL) 300 MG 24 hr tablet Take 1 tablet (300 mg total) by mouth daily. 90 tablet 0  . CALCIUM PO Take by mouth.    . cholecalciferol (VITAMIN D3) 25 MCG (1000 UNIT) tablet Take 1,000 Units by mouth daily.    Marland Kitchen denosumab (PROLIA) 60 MG/ML SOSY injection Inject 60 mg into the skin every 6 (six) months.    . famotidine (PEPCID) 20 MG tablet Take 20 mg by mouth daily as needed.    Marland Kitchen lisdexamfetamine (VYVANSE) 60 MG capsule Take 60 mg by mouth every morning.    . Multiple Vitamin (MULTIVITAMIN) tablet Take by mouth.    . nefazodone (SERZONE) 200 MG tablet Take 1 tablet (200 mg total) by mouth 2 (two) times daily. 180 tablet 0  . potassium chloride (KLOR-CON) 10 MEQ tablet Take 1 tablet by mouth daily.    . pramipexole (MIRAPEX) 0.75 MG tablet Take 1 tablet (0.75 mg total) by mouth every evening. 90 tablet 0  . Probiotic Product (PROBIOTIC PO) Take by mouth.    . promethazine (PHENERGAN) 25 MG tablet Take 25 mg by mouth every 6 (six) hours as needed.    . Romosozumab-aqqg (EVENITY) 105 MG/1.17ML SOSY injection Inject 210 mg into the skin once. (Patient not taking: Reported on 09/13/2021)    .  SUMAtriptan (IMITREX) 100 MG tablet Take by mouth.    . temazepam (RESTORIL) 30 MG capsule Take 30 mg by mouth at bedtime as needed.     No current facility-administered medications for this visit.    Medication Side Effects: Other: Nausea when taking Nefazodone on an empty stomach  Allergies: No Known Allergies  Past Medical History:  Diagnosis Date  . Cancer ([place])   . Hx of detached retina repair     Past Medical History, Surgical history, Social history, and Family history were reviewed and updated as appropriate.   Please see review of systems for further details on the patient's review from today.   Objective:   Physical Exam:  There were no vitals  taken for this visit.  Physical Exam  Lab Review:  No results found for: "NA", "K", "CL", "CO2", "GLUCOSE", "BUN", "CREATININE", "CALCIUM", "PROT", "ALBUMIN", "AST", "ALT", "ALKPHOS", "BILITOT", "GFRNONAA", "GFRAA"  No results found for: "WBC", "RBC", "HGB", "HCT", "PLT", "MCV", "MCH", "MCHC", "RDW", "LYMPHSABS", "MONOABS", "EOSABS", "BASOSABS"  No results found for: "POCLITH", "LITHIUM"   No results found for: "PHENYTOIN", "PHENOBARB", "VALPROATE", "CBMZ"   .res Assessment: Plan:    Emer was seen today for follow-up.  Diagnoses and all orders for this visit:  Depression, unspecified depression type -     buPROPion (WELLBUTRIN XL) 300 MG 24 hr tablet; Take 1 tablet (300 mg total) by mouth daily. -     nefazodone (SERZONE) 200 MG tablet; Take 1 tablet (200 mg total) by mouth 2 (two) times daily.  Generalized anxiety disorder -     nefazodone (SERZONE) 200 MG tablet; Take 1 tablet (200 mg total) by mouth 2 (two) times daily.  Restless legs syndrome (RLS) -     pramipexole (MIRAPEX) 0.75 MG tablet; Take 1 tablet (0.75 mg total) by mouth every evening.     Please see After Visit Summary for patient specific instructions.  No future appointments.  No orders of the defined types were placed in this encounter.   -------------------------------

## 2022-08-31 ENCOUNTER — Encounter: Payer: Self-pay | Admitting: Psychiatry

## 2022-08-31 ENCOUNTER — Ambulatory Visit (INDEPENDENT_AMBULATORY_CARE_PROVIDER_SITE_OTHER): Payer: Medicare Other | Admitting: Psychiatry

## 2022-08-31 DIAGNOSIS — F411 Generalized anxiety disorder: Secondary | ICD-10-CM | POA: Diagnosis not present

## 2022-08-31 DIAGNOSIS — G2581 Restless legs syndrome: Secondary | ICD-10-CM

## 2022-08-31 DIAGNOSIS — F32A Depression, unspecified: Secondary | ICD-10-CM | POA: Diagnosis not present

## 2022-08-31 MED ORDER — PRAMIPEXOLE DIHYDROCHLORIDE 0.75 MG PO TABS: 0.7500 mg | ORAL_TABLET | Freq: Every evening | ORAL | 0 refills | Status: DC

## 2022-08-31 MED ORDER — BUPROPION HCL ER (XL) 300 MG PO TB24: 300.0000 mg | ORAL_TABLET | Freq: Every day | ORAL | 1 refills | Status: DC

## 2022-08-31 MED ORDER — NEFAZODONE HCL 250 MG PO TABS: 250.0000 mg | ORAL_TABLET | Freq: Two times a day (BID) | ORAL | 2 refills | Status: DC

## 2022-08-31 MED ORDER — NEFAZODONE HCL 250 MG PO TABS: 250.0000 mg | ORAL_TABLET | Freq: Two times a day (BID) | ORAL | 1 refills | Status: DC

## 2022-08-31 NOTE — Progress Notes (Signed)
Rebekah Higgins [id number] [birth year] 66 y.o.  Subjective:   Patient ID:  Rebekah Higgins is a 66 y.o. (DOB 1956-09-22) female.  Chief Complaint:  Chief Complaint  Patient presents with   Depression   Anxiety    Depression        Past medical history includes anxiety.   Anxiety     Rebekah Higgins presents to the office today for follow-up of anxiety, depression, and insomnia. She reports that she notices some social withdrawal and has not felt like talking to friends on the phone. She reports that her motivation is low at times for certain things "unless my back is against the wall." She reports that once she talks to someone she feels fine. She has not gotten back into walking routine that she had before she had anemia.   She reports that she will stay up late and then drift off to sleep in her chair and then go to bed. She reports that she then will sleep later. She reports that she enjoys her alone time at night. Sleeping well. Appetite has been good. Concentration is adequate with medication. Denies SI.   She reports that she has anxiety about being "called out."   She reports that she has to take Serzone with food and forgets to take it in the morning about 1/2 the time. She reports that she will feel nauseous if she takes Serzone on an empty.   She reports that she may have taken Serzone 250 mg BID in the past.  Daughter will be moving from [place] to [place]. She reports that she and her daughter are close and this will be difficult for her.    Past Psychiatric Medication Trials: Nefazodone Viibryd Vyvanse- Takes for ADD. Has been helpful for focus and starting her day. Temazepam- Has taken long-term. Initially on 15 mg po QHS  Review of Systems:  Review of Systems  Musculoskeletal:  Negative for gait problem.  Hematological:        Anemia has resolved  Psychiatric/Behavioral:  Positive for depression.        Please refer to HPI    Medications: I have reviewed the  patient's current medications.  Current Outpatient Medications  Medication Sig Dispense Refill   amphetamine-dextroamphetamine (ADDERALL) 10 MG tablet Take by mouth.     Ascorbic Acid (VITAMIN C PO) Take by mouth.     CALCIUM PO Take by mouth.     cholecalciferol (VITAMIN D3) 25 MCG (1000 UNIT) tablet Take 1,000 Units by mouth daily.     ferrous sulfate 324 (65 Fe) MG TBEC Take by mouth.     Multiple Vitamin (MULTIVITAMIN) tablet Take by mouth.     pantoprazole (PROTONIX) 40 MG tablet Take 40 mg by mouth every morning.     potassium chloride (KLOR-CON) 10 MEQ tablet Take 1 tablet by mouth daily.     Probiotic Product (PROBIOTIC PO) Take by mouth.     promethazine (PHENERGAN) 25 MG tablet Take 25 mg by mouth every 6 (six) hours as needed.     SUMAtriptan (IMITREX) 100 MG tablet Take by mouth.     temazepam (RESTORIL) 30 MG capsule Take 30 mg by mouth at bedtime as needed.     buPROPion (WELLBUTRIN XL) 300 MG 24 hr tablet Take 1 tablet (300 mg total) by mouth daily. 90 tablet 1   denosumab (PROLIA) 60 MG/ML SOSY injection Inject 60 mg into the skin every 6 (six) months.     famotidine (PEPCID) 20 MG tablet  Take 20 mg by mouth daily as needed.     lisdexamfetamine (VYVANSE) 60 MG capsule Take 60 mg by mouth every morning.     nefazodone (SERZONE) 250 MG tablet Take 1 tablet (250 mg total) by mouth 2 (two) times daily. 180 tablet 1   pramipexole (MIRAPEX) 0.75 MG tablet Take 1 tablet (0.75 mg total) by mouth every evening. 90 tablet 0   Romosozumab-aqqg (EVENITY) 105 MG/1.17ML SOSY injection Inject 210 mg into the skin once. (Patient not taking: Reported on 09/13/2021)     No current facility-administered medications for this visit.    Medication Side Effects: Nausea if taken on an empty stomach  Allergies: No Known Allergies  Past Medical History:  Diagnosis Date   Cancer ([place])    Hx of detached retina repair     Past Medical History, Surgical history, Social history, and Family  history were reviewed and updated as appropriate.   Please see review of systems for further details on the patient's review from today.   Objective:   Physical Exam:  There were no vitals taken for this visit.  Physical Exam Constitutional:      General: She is not in acute distress. Musculoskeletal:        General: No deformity.  Neurological:     Mental Status: She is alert and oriented to person, place, and time.     Coordination: Coordination normal.  Psychiatric:        Attention and Perception: Attention and perception normal. She does not perceive auditory or visual hallucinations.        Mood and Affect: Affect is not labile, blunt, angry or inappropriate.        Speech: Speech normal.        Behavior: Behavior normal.        Thought Content: Thought content normal. Thought content is not paranoid or delusional. Thought content does not include homicidal or suicidal ideation. Thought content does not include homicidal or suicidal plan.        Cognition and Memory: Cognition and memory normal.        Judgment: Judgment normal.     Comments: Insight intact Mood is mildly depressed and anxious     Lab Review:  No results found for: "NA", "K", "CL", "CO2", "GLUCOSE", "BUN", "CREATININE", "CALCIUM", "PROT", "ALBUMIN", "AST", "ALT", "ALKPHOS", "BILITOT", "GFRNONAA", "GFRAA"  No results found for: "WBC", "RBC", "HGB", "HCT", "PLT", "MCV", "MCH", "MCHC", "RDW", "LYMPHSABS", "MONOABS", "EOSABS", "BASOSABS"  No results found for: "POCLITH", "LITHIUM"   No results found for: "PHENYTOIN", "PHENOBARB", "VALPROATE", "CBMZ"   .res Assessment: Plan:    Pt seen for 30 minutes and time spent discussing potential benefits, risks, and side effects of increasing Serzone to past dose  of 250 mg po BID for mild residual anxiety and depressive s/s. She reports that 200 mg dose has been partially effective and that this dose is "close" to controlling mood and anxiety s/s but that she is  continuing to experience low motivation towards certain things and mild social isolation/avoidance. Will increase Serzone to 250 mg po BID.  Continue Wellbutrin XL 300 mg daily for depression.  Continue Pramipexole 0.75 mg po every evening for RLS.  Vyvanse, Adderall, and Temazepam are managed by her PCP. Pt to follow-up in 2 months or sooner if clinically indicated.  Patient advised to contact office with any questions, adverse effects, or acute worsening in signs and symptoms.   Rebekah Higgins was seen today for depression and anxiety.  Diagnoses and  all orders for this visit:  Depression, unspecified depression type -     Discontinue: nefazodone (SERZONE) 250 MG tablet; Take 1 tablet (250 mg total) by mouth 2 (two) times daily. -     buPROPion (WELLBUTRIN XL) 300 MG 24 hr tablet; Take 1 tablet (300 mg total) by mouth daily. -     nefazodone (SERZONE) 250 MG tablet; Take 1 tablet (250 mg total) by mouth 2 (two) times daily.  Generalized anxiety disorder -     Discontinue: nefazodone (SERZONE) 250 MG tablet; Take 1 tablet (250 mg total) by mouth 2 (two) times daily. -     nefazodone (SERZONE) 250 MG tablet; Take 1 tablet (250 mg total) by mouth 2 (two) times daily.  Restless legs syndrome (RLS) -     pramipexole (MIRAPEX) 0.75 MG tablet; Take 1 tablet (0.75 mg total) by mouth every evening.     Please see After Visit Summary for patient specific instructions.  Future Appointments  Date Time Provider [place]  10/30/2022 11:30 AM Thayer Headings, PMHNP CP-CP None    No orders of the defined types were placed in this encounter.   -------------------------------

## 2022-09-09 ENCOUNTER — Other Ambulatory Visit: Payer: Self-pay | Admitting: Psychiatry

## 2022-09-09 DIAGNOSIS — F32A Depression, unspecified: Secondary | ICD-10-CM

## 2022-09-09 DIAGNOSIS — F411 Generalized anxiety disorder: Secondary | ICD-10-CM

## 2022-09-18 ENCOUNTER — Encounter: Payer: Self-pay | Admitting: *Deleted

## 2022-09-18 ENCOUNTER — Telehealth: Payer: Self-pay | Admitting: *Deleted

## 2022-09-18 NOTE — Patient Outreach (Signed)
  Care Coordination   Initial Visit Note   09/18/2022 Name: Rebekah Higgins MRN: [id number] DOB: July 10, 1956  Rebekah Higgins is a 66 y.o. year old female who sees Rebekah Higgins, Rebekah Higgins, [place] for primary care. I spoke with  Rebekah Higgins by phone today.  What matters to the patients health and wellness today?  "to keep doing well"    Goals Addressed             This Visit's Progress    COMPLETED: "to keep doing well"       Care Coordination Interventions: Patient interviewed about adult health maintenance status including  importance of Annual Wellness Visit, pt reports she completed this few months ago Provided education about importance of taking medication as prescribed Care coordination program explained to patient who is appreciative and agreeable to today's outreach but declines any further outreach Patient reports she lives with her spouse, states no needs for care coordination          SDOH assessments and interventions completed:  Yes  SDOH Interventions Today    Flowsheet Row Most Recent Value  SDOH Interventions   Food Insecurity Interventions Intervention Not Indicated  Transportation Interventions Intervention Not Indicated        Care Coordination Interventions Activated:  Yes  Care Coordination Interventions:  Yes, provided   Follow up plan: No further intervention required.   Encounter Outcome:  Pt. Visit Completed   Rebekah Higgins [facility], BSN [facility] RN Care Coordinator [phone]

## 2022-10-30 ENCOUNTER — Ambulatory Visit: Payer: Medicare Other | Admitting: Psychiatry

## 2022-10-31 ENCOUNTER — Encounter: Payer: Self-pay | Admitting: Psychiatry

## 2022-10-31 ENCOUNTER — Ambulatory Visit (INDEPENDENT_AMBULATORY_CARE_PROVIDER_SITE_OTHER): Payer: Medicare Other | Admitting: Psychiatry

## 2022-10-31 DIAGNOSIS — F32A Depression, unspecified: Secondary | ICD-10-CM | POA: Diagnosis not present

## 2022-10-31 DIAGNOSIS — F411 Generalized anxiety disorder: Secondary | ICD-10-CM

## 2022-10-31 DIAGNOSIS — G2581 Restless legs syndrome: Secondary | ICD-10-CM | POA: Diagnosis not present

## 2022-10-31 MED ORDER — L-METHYLFOLATE 15 MG PO TABS: 15.0000 mg | ORAL_TABLET | Freq: Every day | ORAL | 2 refills | Status: DC

## 2022-10-31 MED ORDER — PRAMIPEXOLE DIHYDROCHLORIDE 0.75 MG PO TABS: 0.7500 mg | ORAL_TABLET | Freq: Every evening | ORAL | 0 refills | Status: DC

## 2022-10-31 NOTE — Progress Notes (Signed)
Rebekah Higgins [id number] [birth year] 66 y.o.  Subjective:   Patient ID:  Rebekah Higgins is a 66 y.o. (DOB July 13, 1956) female.  Chief Complaint:  Chief Complaint  Patient presents with   Depression   Anxiety    HPI Rebekah Higgins presents to the office today for follow-up of anxiety, depression, and insomnia.  Son is in town visiting and daughter is coming to stay with her on Wednesday. Daughter is going to move to [place].   She reports, "I haven't been doing well." She reports that generic Vyvanse has not been as effective for her. She reports that she had to pay out of pocket for brand name. She reports that she has felt better the last few days since taking brand name Vyvanse. She reports that her mood has been lower and "everything my husband says bothers me." She reports that her daughter moving is a loss. Her physical therapist that she saw for years retired and this has been a loss. "I've had a lot of loss... I've realized I don't like change." She reports that she occ will not answer her phone when a friend calls. She has not been walking as much. Energy and motivation were lower last month while taking a generic Vyvanse. She also had a very busy month with some major events and travel. She reports some worry about her children. She reports that her energy, motivation and mood have been better with re-starting brand name Vyvanse. Concentration was worse with generic and improved back on brand name Vyvanse. She reports that she is continuing to stay up later at night to have some alone time. She reports that she had worsening appetite and binge eating with generic Vyvanse.   She is interested in couples therapist. She and her husband have been married 16 years. Husband has been taking dance lessons with her. She reports that she has been working on "letting go of my inhibitions." Denies SI.   She denies any significant change in response to increase in Serzone.    Past Psychiatric  Medication Trials: Nefazodone Wellbutrin XL Viibryd Vyvanse- Takes for ADD. Has been helpful for focus and starting her day. Temazepam- Has taken long-term. Initially on 15 mg po QHS  Review of Systems:  Review of Systems  Gastrointestinal: Negative.   Musculoskeletal:  Negative for gait problem.       Occ ankle pain  Neurological:  Negative for tremors.  Psychiatric/Behavioral:         Please refer to HPI    Medications: I have reviewed the patient's current medications.  Current Outpatient Medications  Medication Sig Dispense Refill   amphetamine-dextroamphetamine (ADDERALL) 10 MG tablet Take by mouth.     Ascorbic Acid (VITAMIN C PO) Take by mouth.     buPROPion (WELLBUTRIN XL) 300 MG 24 hr tablet Take 1 tablet (300 mg total) by mouth daily. 90 tablet 1   CALCIUM PO Take by mouth.     cholecalciferol (VITAMIN D3) 25 MCG (1000 UNIT) tablet Take 1,000 Units by mouth daily.     denosumab (PROLIA) 60 MG/ML SOSY injection Inject 60 mg into the skin every 6 (six) months.     famotidine (PEPCID) 20 MG tablet Take 20 mg by mouth daily as needed.     ferrous sulfate 324 (65 Fe) MG TBEC Take by mouth.     L-Methylfolate 15 MG TABS Take 1 tablet (15 mg total) by mouth daily. 30 tablet 2   lisdexamfetamine (VYVANSE) 60 MG capsule Take 60 mg by  mouth every morning.     Multiple Vitamin (MULTIVITAMIN) tablet Take by mouth.     nefazodone (SERZONE) 250 MG tablet Take 1 tablet (250 mg total) by mouth 2 (two) times daily. 180 tablet 1   pantoprazole (PROTONIX) 40 MG tablet Take 40 mg by mouth every morning.     potassium chloride (KLOR-CON) 10 MEQ tablet Take 1 tablet by mouth daily.     Probiotic Product (PROBIOTIC PO) Take by mouth.     temazepam (RESTORIL) 30 MG capsule Take 30 mg by mouth at bedtime as needed.     pramipexole (MIRAPEX) 0.75 MG tablet Take 1 tablet (0.75 mg total) by mouth every evening. 90 tablet 0   promethazine (PHENERGAN) 25 MG tablet Take 25 mg by mouth every 6 (six)  hours as needed.     Romosozumab-aqqg (EVENITY) 105 MG/1.17ML SOSY injection Inject 210 mg into the skin once. (Patient not taking: Reported on 09/13/2021)     SUMAtriptan (IMITREX) 100 MG tablet Take by mouth.     No current facility-administered medications for this visit.    Medication Side Effects: None  Allergies: No Known Allergies  Past Medical History:  Diagnosis Date   Cancer ([place])    Hx of detached retina repair     Past Medical History, Surgical history, Social history, and Family history were reviewed and updated as appropriate.   Please see review of systems for further details on the patient's review from today.   Objective:   Physical Exam:  There were no vitals taken for this visit.  Physical Exam Constitutional:      General: She is not in acute distress. Musculoskeletal:        General: No deformity.  Neurological:     Mental Status: She is alert and oriented to person, place, and time.     Coordination: Coordination normal.  Psychiatric:        Attention and Perception: Attention and perception normal. She does not perceive auditory or visual hallucinations.        Mood and Affect: Affect is not labile, blunt, angry or inappropriate.        Speech: Speech normal.        Behavior: Behavior normal.        Thought Content: Thought content normal. Thought content is not paranoid or delusional. Thought content does not include homicidal or suicidal ideation. Thought content does not include homicidal or suicidal plan.        Cognition and Memory: Cognition and memory normal.        Judgment: Judgment normal.     Comments: Insight intact Mood is mildly depressed and anxious     Lab Review:  No results found for: "NA", "K", "CL", "CO2", "GLUCOSE", "BUN", "CREATININE", "CALCIUM", "PROT", "ALBUMIN", "AST", "ALT", "ALKPHOS", "BILITOT", "GFRNONAA", "GFRAA"  No results found for: "WBC", "RBC", "HGB", "HCT", "PLT", "MCV", "MCH", "MCHC", "RDW", "LYMPHSABS",  "MONOABS", "EOSABS", "BASOSABS"  No results found for: "POCLITH", "LITHIUM"   No results found for: "PHENYTOIN", "PHENOBARB", "VALPROATE", "CBMZ"   .res Assessment: Plan:    Pt seen for 30 minutes and time spent discussing response to increase in Nefazodone and treatment options. She denies any significant change since increase in Nefazodone. She reports that she experienced worsening mood, energy, motivation, concentration, and binge eating when she took generic Vyvanse last month and that these symptoms improved when she resumed brand name Vyvanse several days ago. She reports that she is also experiencing some sadness in response to losses. Discussed  treatment options and her concern about changing medications over the holidays. Discussed potential benefits, risks, and side effects of De[place]in and pt agrees to trial of augmentation with L-Methylfolate 15 mg daily. Will then re-evaluate mood and anxiety in January and consider medication change if she continues to experience anxiety and depressive s/s.  Continue Wellbutrin XL 300 mg po qd for depression.  Continue Nefazodone 250 mg po BID for depression.  Continue Pramipexole 0.75 mg po every evening for RLS.  (Vyvanse and Temazepam managed by PCP). Discussed possibly inquiring about insurance approval of brand name Vyvanse or requesting different generic manufacturer of Vyvanse.  Patient advised to contact office with any questions, adverse effects, or acute worsening in signs and symptoms.   Rebekah Higgins was seen today for depression and anxiety.  Diagnoses and all orders for this visit:  Depression, unspecified depression type -     L-Methylfolate 15 MG TABS; Take 1 tablet (15 mg total) by mouth daily.  Restless legs syndrome (RLS) -     pramipexole (MIRAPEX) 0.75 MG tablet; Take 1 tablet (0.75 mg total) by mouth every evening.  Generalized anxiety disorder     Please see After Visit Summary for patient specific instructions.  Future  Appointments  Date Time Provider [place]  12/22/2022 10:30 AM Thayer Headings, PMHNP CP-[facility] None    No orders of the defined types were placed in this encounter.   -------------------------------

## 2022-12-22 ENCOUNTER — Ambulatory Visit: Payer: Medicare Other | Admitting: Psychiatry

## 2023-01-20 ENCOUNTER — Telehealth: Payer: Self-pay | Admitting: Psychiatry

## 2023-01-20 DIAGNOSIS — F32A Depression, unspecified: Secondary | ICD-10-CM

## 2023-01-21 NOTE — Telephone Encounter (Signed)
Please call to schedule appt. Canceled F/U last month.

## 2023-01-25 NOTE — Telephone Encounter (Signed)
Lvm for patient to call and schedule

## 2023-01-25 NOTE — Telephone Encounter (Signed)
Pt has scheduled an appt 2/21. Can we RF l-methylfolate?

## 2023-01-25 NOTE — Telephone Encounter (Signed)
Sent!

## 2023-01-31 ENCOUNTER — Ambulatory Visit (INDEPENDENT_AMBULATORY_CARE_PROVIDER_SITE_OTHER): Payer: Medicare Other | Admitting: Psychiatry

## 2023-01-31 ENCOUNTER — Encounter: Payer: Self-pay | Admitting: Psychiatry

## 2023-01-31 DIAGNOSIS — F411 Generalized anxiety disorder: Secondary | ICD-10-CM | POA: Diagnosis not present

## 2023-01-31 DIAGNOSIS — F32A Depression, unspecified: Secondary | ICD-10-CM

## 2023-01-31 DIAGNOSIS — G2581 Restless legs syndrome: Secondary | ICD-10-CM

## 2023-01-31 MED ORDER — BUPROPION HCL ER (XL) 300 MG PO TB24: 300.0000 mg | ORAL_TABLET | Freq: Every day | ORAL | 1 refills | Status: DC

## 2023-01-31 MED ORDER — PRAMIPEXOLE DIHYDROCHLORIDE 0.75 MG PO TABS: 0.7500 mg | ORAL_TABLET | Freq: Every evening | ORAL | 1 refills | Status: DC

## 2023-01-31 MED ORDER — L-METHYLFOLATE 15 MG PO TABS: 15.0000 mg | ORAL_TABLET | Freq: Every day | ORAL | 3 refills | Status: DC

## 2023-01-31 MED ORDER — NEFAZODONE HCL 250 MG PO TABS: 250.0000 mg | ORAL_TABLET | Freq: Two times a day (BID) | ORAL | 1 refills | Status: DC

## 2023-01-31 NOTE — Progress Notes (Signed)
Rebekah Higgins [id number] 04-Apr-1956 67 y.o.  Subjective:   Patient ID:  Rebekah Higgins is a 67 y.o. (DOB 25-Feb-1956) female.  Chief Complaint:  Chief Complaint  Patient presents with   Follow-up    Depression and anxiety    HPI Rebekah Higgins presents to the office today for follow-up of depression and anxiety. Daughter has moved to [place], [place]. She went to visit her for 3 days. She reports, "the hard part was before she left" and she has not adjusted to it.  She reports that she thinks L-methylfolate has been helpful. She reports that she is taking it at night. She does not notice significant sad mood. "I'm content to do nothing." She reports that once she gets started on something- "I feel like my normal self." Energy is "ok." She reports that she is not staying up as late at night and is going to bed earlier. She notices she is not eating as much since going to bed earlier. Concentration has been ok with medication. Denies anxiety. "I worry and I always have." Denies SI.   She reports that there are times where her motivation is somewhat low.   She reports that her new Medicare plan does not cover several meds.  Husband has been helping with a Water quality scientist.   Son is turning 68 and they are going to [place] to celebrate with him.    Past Psychiatric Medication Trials: Nefazodone Wellbutrin XL Viibryd Vyvanse- Takes for ADD. Has been helpful for focus and starting her day. Temazepam- Has taken long-term. Initially on 15 mg po QHS  Review of Systems:  Review of Systems  Respiratory:  Negative for shortness of breath.   Musculoskeletal:  Negative for gait problem.  Psychiatric/Behavioral:         Please refer to HPI    Medications: I have reviewed the patient's current medications.  Current Outpatient Medications  Medication Sig Dispense Refill   amphetamine-dextroamphetamine (ADDERALL) 10 MG tablet Take by mouth.     Ascorbic Acid (VITAMIN C PO) Take by mouth.      buPROPion (WELLBUTRIN XL) 300 MG 24 hr tablet Take 1 tablet (300 mg total) by mouth daily. 90 tablet 1   CALCIUM PO Take by mouth.     cholecalciferol (VITAMIN D3) 25 MCG (1000 UNIT) tablet Take 1,000 Units by mouth daily.     denosumab (PROLIA) 60 MG/ML SOSY injection Inject 60 mg into the skin every 6 (six) months.     famotidine (PEPCID) 20 MG tablet Take 20 mg by mouth daily as needed.     ferrous sulfate 324 (65 Fe) MG TBEC Take by mouth.     L-Methylfolate 15 MG TABS Take 1 tablet (15 mg total) by mouth daily. 90 tablet 3   lisdexamfetamine (VYVANSE) 60 MG capsule Take 60 mg by mouth every morning.     Multiple Vitamin (MULTIVITAMIN) tablet Take by mouth.     nefazodone (SERZONE) 250 MG tablet Take 1 tablet (250 mg total) by mouth 2 (two) times daily. 180 tablet 1   pantoprazole (PROTONIX) 40 MG tablet Take 40 mg by mouth every morning.     potassium chloride (KLOR-CON) 10 MEQ tablet Take 1 tablet by mouth daily.     pramipexole (MIRAPEX) 0.75 MG tablet Take 1 tablet (0.75 mg total) by mouth every evening. 90 tablet 1   Probiotic Product (PROBIOTIC PO) Take by mouth.     promethazine (PHENERGAN) 25 MG tablet Take 25 mg by mouth every 6 (six) hours  as needed.     Romosozumab-aqqg (EVENITY) 105 MG/1.17ML SOSY injection Inject 210 mg into the skin once. (Patient not taking: Reported on 09/13/2021)     SUMAtriptan (IMITREX) 100 MG tablet Take by mouth.     temazepam (RESTORIL) 30 MG capsule Take 30 mg by mouth at bedtime as needed.     No current facility-administered medications for this visit.    Medication Side Effects: None  Allergies: No Known Allergies  Past Medical History:  Diagnosis Date   Cancer ([place])    Hx of detached retina repair     Past Medical History, Surgical history, Social history, and Family history were reviewed and updated as appropriate.   Please see review of systems for further details on the patient's review from today.   Objective:   Physical Exam:   There were no vitals taken for this visit.  Physical Exam Constitutional:      General: She is not in acute distress. Musculoskeletal:        General: No deformity.  Neurological:     Mental Status: She is alert and oriented to person, place, and time.     Coordination: Coordination normal.  Psychiatric:        Attention and Perception: Attention and perception normal. She does not perceive auditory or visual hallucinations.        Mood and Affect: Mood normal. Mood is not anxious or depressed. Affect is not labile, blunt, angry or inappropriate.        Speech: Speech normal.        Behavior: Behavior normal.        Thought Content: Thought content normal. Thought content is not paranoid or delusional. Thought content does not include homicidal or suicidal ideation. Thought content does not include homicidal or suicidal plan.        Cognition and Memory: Cognition and memory normal.        Judgment: Judgment normal.     Comments: Insight intact     Lab Review:  No results found for: "NA", "K", "CL", "CO2", "GLUCOSE", "BUN", "CREATININE", "CALCIUM", "PROT", "ALBUMIN", "AST", "ALT", "ALKPHOS", "BILITOT", "GFRNONAA", "GFRAA"  No results found for: "WBC", "RBC", "HGB", "HCT", "PLT", "MCV", "MCH", "MCHC", "RDW", "LYMPHSABS", "MONOABS", "EOSABS", "BASOSABS"  No results found for: "POCLITH", "LITHIUM"   No results found for: "PHENYTOIN", "PHENOBARB", "VALPROATE", "CBMZ"   .res Assessment: Plan:    Pt seen for 30 minutes and time spent discussing response to L-methylfolate. She reports that she has noticed some improvement in mood and energy since starting L-methylfolate. She denies depressed mood and reports that her energy and motivation continue to be slightly lower than she would like at times. She reports that she has upcoming apt with hematology/oncology and would like to determine if anemia is controlled and if energy and motivation improve with springtime approaching. She reports  that she prefers to continue current medications without changes at this time.  Continue L-methylfolate 15 mg po qd for depression.  Continue Wellbutrin XL 300 mg po qd for depression and off-label indication of ADHD.  Will continue Nefazodone 250 mg po BID for depression and anxiety.  Continue Pramipexole 0.75 mg po q evening for RLS.  (Temazepam and treatment for ADHD are managed by PCP).  Pt to follow-up in 2 months or sooner if clinically indicated.  Patient advised to contact office with any questions, adverse effects, or acute worsening in signs and symptoms.   Rebekah Higgins was seen today for follow-up.  Diagnoses and all orders  for this visit:  Depression, unspecified depression type -     L-Methylfolate 15 MG TABS; Take 1 tablet (15 mg total) by mouth daily. -     buPROPion (WELLBUTRIN XL) 300 MG 24 hr tablet; Take 1 tablet (300 mg total) by mouth daily. -     nefazodone (SERZONE) 250 MG tablet; Take 1 tablet (250 mg total) by mouth 2 (two) times daily.  Generalized anxiety disorder -     nefazodone (SERZONE) 250 MG tablet; Take 1 tablet (250 mg total) by mouth 2 (two) times daily.  Restless legs syndrome (RLS) -     pramipexole (MIRAPEX) 0.75 MG tablet; Take 1 tablet (0.75 mg total) by mouth every evening.     Please see After Visit Summary for patient specific instructions.  Future Appointments  Date Time Provider [place]  04/02/2023  1:30 PM Thayer Headings, PMHNP CP-[facility] None    No orders of the defined types were placed in this encounter.   -------------------------------

## 2023-03-30 ENCOUNTER — Other Ambulatory Visit: Payer: Self-pay | Admitting: Psychiatry

## 2023-03-30 DIAGNOSIS — F32A Depression, unspecified: Secondary | ICD-10-CM

## 2023-03-30 DIAGNOSIS — F411 Generalized anxiety disorder: Secondary | ICD-10-CM

## 2023-04-01 NOTE — Telephone Encounter (Signed)
Has appt 4/25

## 2023-04-02 ENCOUNTER — Ambulatory Visit: Payer: Medicare Other | Admitting: Psychiatry

## 2023-04-05 ENCOUNTER — Ambulatory Visit (INDEPENDENT_AMBULATORY_CARE_PROVIDER_SITE_OTHER): Payer: Medicare Other | Admitting: Psychiatry

## 2023-04-05 ENCOUNTER — Encounter: Payer: Self-pay | Admitting: Psychiatry

## 2023-04-05 DIAGNOSIS — F32A Depression, unspecified: Secondary | ICD-10-CM | POA: Diagnosis not present

## 2023-04-05 DIAGNOSIS — F411 Generalized anxiety disorder: Secondary | ICD-10-CM | POA: Diagnosis not present

## 2023-04-05 MED ORDER — L-METHYLFOLATE 15 MG PO TABS: 15.0000 mg | ORAL_TABLET | Freq: Every day | ORAL | 3 refills | Status: AC

## 2023-04-05 MED ORDER — NEFAZODONE HCL 250 MG PO TABS: 250.0000 mg | ORAL_TABLET | Freq: Two times a day (BID) | ORAL | 1 refills | Status: DC

## 2023-04-05 MED ORDER — AUVELITY 45-105 MG PO TBCR
EXTENDED_RELEASE_TABLET | ORAL | 0 refills | Status: AC
Start: 2023-04-05 — End: ?

## 2023-04-05 NOTE — Progress Notes (Signed)
Rebekah Higgins [id number] [birth year] 67 y.o.  Subjective:   Patient ID:  Rebekah Higgins is a 67 y.o. (DOB 03-30-1956) female.  Chief Complaint:  Chief Complaint  Patient presents with   Follow-up    Depression, anxiety, and insomnia    HPI [name] presents to the office today for follow-up of depression, anxiety, and insomnia. She reports that she has not had L-Methylfolate for the last 6 weeks. She reports that she has been less interested in things. She reports that she has difficulty getting started with things "and once I get started, I'm ok." Reports feeling less productive. She reports "lack of interest." She reports that she will not answer phone calls from friends- "just too much trouble." She reports that she feels somewhat sad. She reports that she likes to think about getting things set in place. She is staying up later and sleeping until 10 am. She reports enjoying time to herself at night. Concentration is ok when she is engaged in an activity. Denies anhedonia- enjoys dance lessons and time with family. She reports that she has intentionally lost 10-12 lbs. Denies SI.   She and her husband have been taking dance lessons. She is going out to dinner with her husband and friends 1-2 times a week.   Sister contacted her about 2 weeks ago and told her that she and her children do not want contact from her. Sister once cut her off for 2 years in the past. She reports that sister's message was unexpected and she is not sure what triggered this. Sister lives at [facility].     Past Psychiatric Medication Trials: Nefazodone Wellbutrin XL Viibryd Vyvanse- Takes for ADD. Has been helpful for focus and starting her day. Temazepam- Has taken long-term. Initially on 15 mg po QHS  Review of Systems:  Review of Systems  Gastrointestinal: Negative.   Musculoskeletal:  Negative for gait problem.  Neurological:  Negative for headaches.  Psychiatric/Behavioral:         Please refer  to HPI    Medications: I have reviewed the patient's current medications.  Current Outpatient Medications  Medication Sig Dispense Refill   Dextromethorphan-buPROPion ER (AUVELITY) 45-105 MG TBCR Take 1 tablet daily for 3 days, then increase to 1 tablet twice daily 30 tablet 0   amphetamine-dextroamphetamine (ADDERALL) 10 MG tablet Take by mouth.     Ascorbic Acid (VITAMIN C PO) Take by mouth.     buPROPion (WELLBUTRIN XL) 300 MG 24 hr tablet Take 1 tablet (300 mg total) by mouth daily. 90 tablet 1   CALCIUM PO Take by mouth.     cholecalciferol (VITAMIN D3) 25 MCG (1000 UNIT) tablet Take 1,000 Units by mouth daily.     denosumab (PROLIA) 60 MG/ML SOSY injection Inject 60 mg into the skin every 6 (six) months.     famotidine (PEPCID) 20 MG tablet Take 20 mg by mouth daily as needed.     ferrous sulfate 324 (65 Fe) MG TBEC Take by mouth.     L-Methylfolate 15 MG TABS Take 1 tablet (15 mg total) by mouth daily. 90 tablet 3   lisdexamfetamine (VYVANSE) 60 MG capsule Take 60 mg by mouth every morning.     Multiple Vitamin (MULTIVITAMIN) tablet Take by mouth.     nefazodone (SERZONE) 250 MG tablet Take 1 tablet (250 mg total) by mouth 2 (two) times daily. 180 tablet 1   pantoprazole (PROTONIX) 40 MG tablet Take 40 mg by mouth every morning.  potassium chloride (KLOR-CON) 10 MEQ tablet Take 1 tablet by mouth daily.     pramipexole (MIRAPEX) 0.75 MG tablet Take 1 tablet (0.75 mg total) by mouth every evening. 90 tablet 1   Probiotic Product (PROBIOTIC PO) Take by mouth.     promethazine (PHENERGAN) 25 MG tablet Take 25 mg by mouth every 6 (six) hours as needed.     Romosozumab-aqqg (EVENITY) 105 MG/1. SOSY injection Inject 210 mg into the skin once. (Patient not taking: Reported on 09/13/2021)     SUMAtriptan (IMITREX) 100 MG tablet Take by mouth.     temazepam (RESTORIL) 30 MG capsule Take 30 mg by mouth at bedtime as needed.     No current facility-administered medications for this  visit.    Medication Side Effects: None  Allergies: No Known Allergies  Past Medical History:  Diagnosis Date   Cancer ([facility])    Hx of detached retina repair     Past Medical History, Surgical history, Social history, and Family history were reviewed and updated as appropriate.   Please see review of systems for further details on the patient's review from today.   Objective:   Physical Exam:  There were no vitals taken for this visit.  Physical Exam Constitutional:      General: She is not in acute distress. Musculoskeletal:        General: No deformity.  Neurological:     Mental Status: She is alert and oriented to person, place, and time.     Coordination: Coordination normal.  Psychiatric:        Attention and Perception: Attention and perception normal. She does not perceive auditory or visual hallucinations.        Mood and Affect: Mood is anxious and depressed. Affect is not labile, blunt, angry or inappropriate.        Speech: Speech normal.        Behavior: Behavior normal.        Thought Content: Thought content normal. Thought content is not paranoid or delusional. Thought content does not include homicidal or suicidal ideation. Thought content does not include homicidal or suicidal plan.        Cognition and Memory: Cognition and memory normal.        Judgment: Judgment normal.     Comments: Insight intact     Lab Review:  No results found for: "NA", "K", "CL", "CO2", "GLUCOSE", "BUN", "CREATININE", "CALCIUM", "PROT", "ALBUMIN", "AST", "ALT", "ALKPHOS", "BILITOT", "GFRNONAA", "GFRAA"  No results found for: "WBC", "RBC", "HGB", "HCT", "PLT", "MCV", "MCH", "MCHC", "RDW", "LYMPHSABS", "MONOABS", "EOSABS", "BASOSABS"  No results found for: "POCLITH", "LITHIUM"   No results found for: "PHENYTOIN", "PHENOBARB", "VALPROATE", "CBMZ"   .res Assessment: Plan:    I spent 30 minutes dedicated to the care of this patient on the date of this  encounter to include  pre-visit review of records, face-to-face time with the patient discussing treatment options for depression, and post visit documentation. Discussed potential benefits, risks, and side effects of Auvelity. Will start Auvelity 45-105 mg one tablet daily for 3 days, then increase Auvelity to one tablet twice daily for depression. Advised pt to stop taking Bupropion with start of Auvelity since Auvelity contains Bupropion.  Recommend re-starting L-Methylfolate 15 mg po qd for depression.  Continue Nefazodone 250 mg po BID for depression.  Continue Pramipexole 0.75 mg po QHS for RLS and off-label indication of depression.  Pt to follow-up with this provider in 4 weeks or sooner if clinically indicated.  Patient advised to contact office with any questions, adverse effects, or acute worsening in signs and symptoms.   Alahna was seen today for follow-up.  Diagnoses and all orders for this visit:  Depression, unspecified depression type -     L-Methylfolate 15 MG TABS; Take 1 tablet (15 mg total) by mouth daily. -     nefazodone (SERZONE) 250 MG tablet; Take 1 tablet (250 mg total) by mouth 2 (two) times daily. -     Dextromethorphan-buPROPion ER (AUVELITY) 45-105 MG TBCR; Take 1 tablet daily for 3 days, then increase to 1 tablet twice daily  Generalized anxiety disorder -     nefazodone (SERZONE) 250 MG tablet; Take 1 tablet (250 mg total) by mouth 2 (two) times daily.     Please see After Visit Summary for patient specific instructions.  Future Appointments  Date Time Provider Department [facility]  05/01/2023  9:30 AM Corie Chiquito, PMHNP [facility] None    No orders of the defined types were placed in this encounter.   -------------------------------

## 2023-05-01 ENCOUNTER — Ambulatory Visit: Payer: Medicare Other | Admitting: Psychiatry

## 2023-05-02 ENCOUNTER — Ambulatory Visit (INDEPENDENT_AMBULATORY_CARE_PROVIDER_SITE_OTHER): Payer: Medicare Other | Admitting: Psychiatry

## 2023-05-02 ENCOUNTER — Encounter: Payer: Self-pay | Admitting: Psychiatry

## 2023-05-02 DIAGNOSIS — F32A Depression, unspecified: Secondary | ICD-10-CM

## 2023-05-02 DIAGNOSIS — G2581 Restless legs syndrome: Secondary | ICD-10-CM | POA: Diagnosis not present

## 2023-05-02 DIAGNOSIS — F411 Generalized anxiety disorder: Secondary | ICD-10-CM

## 2023-05-02 NOTE — Progress Notes (Signed)
Rebekah Higgins [id number] January 20, 1956 67 y.o.  Subjective:   Patient ID:  Rebekah Higgins is a 67 y.o. (DOB 28-Jan-1956) female.  Chief Complaint:  Chief Complaint  Patient presents with   Follow-up    Depression, Anxiety    HPI Rebekah Higgins presents to the office today for follow-up of depression, anxiety, and insomnia. Rebekah Higgins reports "sometimes I feel sort of loopish in the mornings... maybe a little dizzy, a little foggy." Rebekah Higgins reports, "I think the meds have helped." Rebekah Higgins reports that her energy has been good. Rebekah Higgins reports that Rebekah Higgins sometimes does not immediately answer her phone with friends call, usually because it will interrupt things, and will call them back later. Denies anhedonia. Enjoys going out to dinner with friends. Enjoyed recent beach trip. Rebekah Higgins reports "my mood is probably a little bit better." Rebekah Higgins reports that Vyvanse is very helpful for her concentration, energy, and motivation. Rebekah Higgins reports that appetite is ok during the day and eats more at night. Denies SI.   "I still struggle with the eating at night." Rebekah Higgins reports that Rebekah Higgins enjoys her time alone at night. Rebekah Higgins is staying away until 3 am and then sleeping late. Rebekah Higgins reports that Rebekah Higgins may not be getting enough sleep as a result.   Rebekah Higgins reports that her daughter is doing well and has a new boyfriend. Son has been with his girlfriend for about a year. Dog has an oral mass. Rebekah Higgins enjoys her dog.   Sister has not contacted her recently.    Past Psychiatric Medication Trials: Nefazodone Wellbutrin XL Viibryd Vyvanse- Takes for ADD. Has been helpful for focus and starting her day. Temazepam- Has taken long-term. Initially on 15 mg po QHS    Review of Systems:  Review of Systems  HENT:  Positive for dental problem.   Musculoskeletal:  Negative for gait problem.  Neurological:  Negative for tremors and headaches.  Psychiatric/Behavioral:         Please refer to HPI    Medications: I have reviewed the patient's current  medications.  Current Outpatient Medications  Medication Sig Dispense Refill   amphetamine-dextroamphetamine (ADDERALL) 10 MG tablet Take by mouth.     Ascorbic Acid (VITAMIN C PO) Take by mouth.     CALCIUM PO Take by mouth.     cholecalciferol (VITAMIN D3) 25 MCG (1000 UNIT) tablet Take 1,000 Units by mouth daily.     denosumab (PROLIA) 60 MG/ML SOSY injection Inject 60 mg into the skin every 6 (six) months.     Dextromethorphan-buPROPion ER (AUVELITY) 45-105 MG TBCR Take 1 tablet daily for 3 days, then increase to 1 tablet twice daily 30 tablet 0   famotidine (PEPCID) 20 MG tablet Take 20 mg by mouth daily as needed.     ferrous sulfate 324 (65 Fe) MG TBEC Take by mouth.     L-Methylfolate 15 MG TABS Take 1 tablet (15 mg total) by mouth daily. 90 tablet 3   lisdexamfetamine (VYVANSE) 60 MG capsule Take 60 mg by mouth every morning.     Multiple Vitamin (MULTIVITAMIN) tablet Take by mouth.     nefazodone (SERZONE) 250 MG tablet Take 1 tablet (250 mg total) by mouth 2 (two) times daily. 180 tablet 1   pantoprazole (PROTONIX) 40 MG tablet Take 40 mg by mouth every morning.     potassium chloride (KLOR-CON) 10 MEQ tablet Take 1 tablet by mouth daily.     pramipexole (MIRAPEX) 0.75 MG tablet Take 1 tablet (0.75 mg total) by mouth every  evening. 90 tablet 1   Probiotic Product (PROBIOTIC PO) Take by mouth.     promethazine (PHENERGAN) 25 MG tablet Take 25 mg by mouth every 6 (six) hours as needed.     Romosozumab-aqqg (EVENITY) 105 MG/1. SOSY injection Inject 210 mg into the skin once. (Patient not taking: Reported on 09/13/2021)     SUMAtriptan (IMITREX) 100 MG tablet Take by mouth.     temazepam (RESTORIL) 30 MG capsule Take 30 mg by mouth at bedtime as needed.     No current facility-administered medications for this visit.    Medication Side Effects: Other: Occasional "loopish" feeling  Allergies: No Known Allergies  Past Medical History:  Diagnosis Date   Cancer (HCC)    Hx of  detached retina repair     Past Medical History, Surgical history, Social history, and Family history were reviewed and updated as appropriate.   Please see review of systems for further details on the patient's review from today.   Objective:   Physical Exam:  There were no vitals taken for this visit.  Physical Exam Constitutional:      General: Rebekah Higgins is not in acute distress. Musculoskeletal:        General: No deformity.  Neurological:     Mental Status: Rebekah Higgins is alert and oriented to person, place, and time.     Coordination: Coordination normal.  Psychiatric:        Attention and Perception: Attention and perception normal. Rebekah Higgins does not perceive auditory or visual hallucinations.        Mood and Affect: Mood is not anxious. Affect is not labile, blunt, angry or inappropriate.        Speech: Speech normal.        Behavior: Behavior normal.        Thought Content: Thought content normal. Thought content is not paranoid or delusional. Thought content does not include homicidal or suicidal ideation. Thought content does not include homicidal or suicidal plan.        Cognition and Memory: Cognition and memory normal.        Judgment: Judgment normal.     Comments: Insight intact Mood presents as less depressed     Lab Review:  No results found for: "NA", "K", "CL", "CO2", "GLUCOSE", "BUN", "CREATININE", "CALCIUM", "PROT", "ALBUMIN", "AST", "ALT", "ALKPHOS", "BILITOT", "GFRNONAA", "GFRAA"  No results found for: "WBC", "RBC", "HGB", "HCT", "PLT", "MCV", "MCH", "MCHC", "RDW", "LYMPHSABS", "MONOABS", "EOSABS", "BASOSABS"  No results found for: "POCLITH", "LITHIUM"   No results found for: "PHENYTOIN", "PHENOBARB", "VALPROATE", "CBMZ"   .res Assessment: Plan:    Rebekah Higgins reports that Rebekah Higgins would like to continue trial of Auvelity since Rebekah Higgins has noticed improved mood and side effects have improved and are no longer occurring daily.  Will re-start L-Methylfolate 15 mg daily since pt  reports lapse in treatment since her pharmacy was unable to fill and recommended OTC l-methylfolate.  Continue Pramipexole 0.75 mg po every evening for RLS.  Temazepam, Adderall, and Vyvanse prescribed by her PCP.  Pt to follow-up with this provider in 6 weeks or sooner if clinically indicated.  Patient advised to contact office with any questions, adverse effects, or acute worsening in signs and symptoms.  I spent 30 minutes dedicated to the care of this patient on the date of this  encounter to include pre-visit review of records, face-to-face time with the patient discussing treatment options, ordering of medication, and post visit documentation.   Ramsey was seen today for follow-up.  Diagnoses  and all orders for this visit:  Depression, unspecified depression type  Generalized anxiety disorder  Restless legs syndrome (RLS)     Please see After Visit Summary for patient specific instructions.  Future Appointments  Date Time Provider Department Center  06/18/2023  2:00 PM Corie Chiquito, PMHNP [facility] None    No orders of the defined types were placed in this encounter.   -------------------------------

## 2023-06-13 ENCOUNTER — Ambulatory Visit: Payer: Medicare Other | Admitting: Psychiatry

## 2023-06-18 ENCOUNTER — Ambulatory Visit (INDEPENDENT_AMBULATORY_CARE_PROVIDER_SITE_OTHER): Payer: Medicare Other | Admitting: Psychiatry

## 2023-06-18 ENCOUNTER — Encounter: Payer: Self-pay | Admitting: Psychiatry

## 2023-06-18 DIAGNOSIS — F411 Generalized anxiety disorder: Secondary | ICD-10-CM | POA: Diagnosis not present

## 2023-06-18 DIAGNOSIS — F32A Depression, unspecified: Secondary | ICD-10-CM

## 2023-06-18 DIAGNOSIS — G2581 Restless legs syndrome: Secondary | ICD-10-CM

## 2023-06-18 MED ORDER — NEFAZODONE HCL 150 MG PO TABS
300.0000 mg | ORAL_TABLET | Freq: Two times a day (BID) | ORAL | 1 refills | Status: DC
Start: 2023-06-18 — End: 2023-08-02

## 2023-06-18 MED ORDER — PRAMIPEXOLE DIHYDROCHLORIDE 0.75 MG PO TABS
0.7500 mg | ORAL_TABLET | Freq: Every evening | ORAL | 1 refills | Status: DC
Start: 2023-06-18 — End: 2023-10-30

## 2023-06-18 MED ORDER — AUVELITY 45-105 MG PO TBCR
1.0000 | EXTENDED_RELEASE_TABLET | Freq: Two times a day (BID) | ORAL | 2 refills | Status: DC
Start: 2023-06-18 — End: 2023-08-02

## 2023-06-18 NOTE — Progress Notes (Unsigned)
Porter Pliner [id number] 1956/08/09 67 y.o.  Subjective:   Patient ID:  Rebekah Higgins is a 67 y.o. (DOB Jun 06, 1956) female.  Chief Complaint: No chief complaint on file.   Rebekah Nickey Nimer presents to the office today for follow-up of depression and ADHD.   "I've been doing pretty good. There's been no remarkable improvement." Rebekah Higgins reports some periods of low mood and sadness. Rebekah Higgins reports that Rebekah motivation is low and Rebekah Higgins has to push herself to do things- "but when I get going, I am fine." Energy has been ok. Rebekah Higgins reports that Rebekah Higgins has been taking stock and thinking about how Rebekah Higgins would like to live the rest of Rebekah life and what Rebekah Higgins would like to accomplish. Denies anxious thoughts and worry. Rebekah Higgins has been staying up later and sleeping later. Rebekah Higgins would like to change this pattern. Denies change in concentration. Appetite has been good. Denies SI.   Rebekah Higgins died about 2 years ago.   Rebekah Higgins reports that Rebekah Higgins are doing well.   Past Psychiatric Medication Trials: Nefazodone Wellbutrin XL Auvelity Viibryd- Minimally effective Vyvanse- Takes for ADD. Has been helpful for focus and starting Rebekah day. Temazepam- Has taken long-term. Initially on 15 mg po QHS    Review of Systems:  Review of Systems  HENT:  Positive for dental problem.   Musculoskeletal:  Negative for gait problem.  Neurological:  Negative for tremors.  Psychiatric/Behavioral:         Please refer to Rebekah    Medications: I have reviewed the patient's current medications.  Current Outpatient Medications  Medication Sig Dispense Refill   amphetamine-dextroamphetamine (ADDERALL) 10 MG tablet Take by mouth.     Ascorbic Acid (VITAMIN C PO) Take by mouth.     CALCIUM PO Take by mouth.     cholecalciferol (VITAMIN D3) 25 MCG (1000 UNIT) tablet Take 1,000 Units by mouth daily.     denosumab (PROLIA) 60 MG/ML SOSY injection Inject 60 mg into the skin every 6 (six) months.     Dextromethorphan-buPROPion ER (AUVELITY) 45-105  MG TBCR Take 1 tablet daily for 3 days, then increase to 1 tablet twice daily 30 tablet 0   famotidine (PEPCID) 20 MG tablet Take 20 mg by mouth daily as needed.     ferrous sulfate 324 (65 Fe) MG TBEC Take by mouth. (Patient not taking: Reported on 06/18/2023)     L-Methylfolate 15 MG TABS Take 1 tablet (15 mg total) by mouth daily. 90 tablet 3   lisdexamfetamine (VYVANSE) 60 MG capsule Take 60 mg by mouth every morning.     Multiple Vitamin (MULTIVITAMIN) tablet Take by mouth.     nefazodone (SERZONE) 250 MG tablet Take 1 tablet (250 mg total) by mouth 2 (two) times daily. 180 tablet 1   pantoprazole (PROTONIX) 40 MG tablet Take 40 mg by mouth every morning.     potassium chloride (KLOR-CON) 10 MEQ tablet Take 1 tablet by mouth daily.     pramipexole (MIRAPEX) 0.75 MG tablet Take 1 tablet (0.75 mg total) by mouth every evening. 90 tablet 1   Probiotic Product (PROBIOTIC PO) Take by mouth.     promethazine (PHENERGAN) 25 MG tablet Take 25 mg by mouth every 6 (six) hours as needed.     Romosozumab-aqqg (EVENITY) 105 MG/1. SOSY injection Inject 210 mg into the skin once. (Patient not taking: Reported on 09/13/2021)     SUMAtriptan (IMITREX) 100 MG tablet Take by mouth.     temazepam (RESTORIL) 30 MG capsule  Take 30 mg by mouth at bedtime as needed.     No current facility-administered medications for this visit.    Medication Side Effects: None  Allergies: No Known Allergies  Past Medical History:  Diagnosis Date   Cancer (HCC)    Hx of detached retina repair     Past Medical History, Surgical history, Social history, and Family history were reviewed and updated as appropriate.   Please see review of systems for further details on the patient's review from today.   Objective:   Physical Exam:  There were no vitals taken for this visit.  Physical Exam  Lab Review:  No results found for: "NA", "K", "CL", "CO2", "GLUCOSE", "BUN", "CREATININE", "CALCIUM", "PROT", "ALBUMIN",  "AST", "ALT", "ALKPHOS", "BILITOT", "GFRNONAA", "GFRAA"  No results found for: "WBC", "RBC", "HGB", "HCT", "PLT", "MCV", "MCH", "MCHC", "RDW", "LYMPHSABS", "MONOABS", "EOSABS", "BASOSABS"  No results found for: "POCLITH", "LITHIUM"   No results found for: "PHENYTOIN", "PHENOBARB", "VALPROATE", "CBMZ"   .res Assessment: Plan:    There are no diagnoses linked to this encounter.   Please see After Visit Summary for patient specific instructions.  No future appointments.  No orders of the defined types were placed in this encounter.   -------------------------------

## 2023-08-02 ENCOUNTER — Encounter: Payer: Self-pay | Admitting: Psychiatry

## 2023-08-02 ENCOUNTER — Ambulatory Visit (INDEPENDENT_AMBULATORY_CARE_PROVIDER_SITE_OTHER): Payer: Medicare Other | Admitting: Psychiatry

## 2023-08-02 DIAGNOSIS — F411 Generalized anxiety disorder: Secondary | ICD-10-CM | POA: Diagnosis not present

## 2023-08-02 DIAGNOSIS — F32A Depression, unspecified: Secondary | ICD-10-CM | POA: Diagnosis not present

## 2023-08-02 MED ORDER — AUVELITY 45-105 MG PO TBCR: 1.0000 | EXTENDED_RELEASE_TABLET | Freq: Two times a day (BID) | ORAL | 2 refills | Status: DC

## 2023-08-02 MED ORDER — NEFAZODONE HCL 150 MG PO TABS
300.0000 mg | ORAL_TABLET | Freq: Two times a day (BID) | ORAL | 2 refills | Status: DC
Start: 2023-08-02 — End: 2023-10-30

## 2023-08-02 NOTE — Progress Notes (Signed)
Rebekah Higgins [id number] 10-05-1956 67 y.o.  Subjective:   Patient ID:  Rebekah Higgins is a 67 y.o. (DOB [birth year]) female.  Chief Complaint:  Chief Complaint  Patient presents with   Follow-up    Depression, Anxiety    HPI Rebekah Higgins presents to the office today for follow-up of depression and anxiety. She reports, "I'm feeling doing ok." She reports improved motivation. She reports, "I feel very cheery." She reports that she has been working on adjusting her sleep schedule to go to bed not as late and to wake up earlier. She reports anxiety has been ok. Denies persistent sad mood. Energy and motivation have improved. She has been gardening some. She reports that she has been communicating more with friends. She reports that she has intentionally lost 10 lbs. She reports that concentration is improved with Vyvanse and Adderall. Denies SI.   She reports that she has ordered l-methylfolate.  She reports that she was asked to participate in an intervention  She is looking forward to beach trip with family. She reports that this will be first tim  Review of Systems:  Review of Systems  Musculoskeletal:  Negative for gait problem.  Neurological:  Negative for tremors.  Psychiatric/Behavioral:         Please refer to HPI    Medications: I have reviewed the patient's current medications.  Current Outpatient Medications  Medication Sig Dispense Refill   amphetamine-dextroamphetamine (ADDERALL) 10 MG tablet Take by mouth.     Ascorbic Acid (VITAMIN C PO) Take by mouth.     CALCIUM PO Take by mouth.     cholecalciferol (VITAMIN D3) 25 MCG (1000 UNIT) tablet Take 1,000 Units by mouth daily.     Dextromethorphan-buPROPion ER (AUVELITY) 45-105 MG TBCR Take 1 tablet daily for 3 days, then increase to 1 tablet twice daily 30 tablet 0   famotidine (PEPCID) 20 MG tablet Take 20 mg by mouth daily as needed.     lisdexamfetamine (VYVANSE) 60 MG capsule Take 60 mg by mouth every morning.      Multiple Vitamin (MULTIVITAMIN) tablet Take by mouth.     pantoprazole (PROTONIX) 40 MG tablet Take 40 mg by mouth every morning.     potassium chloride (KLOR-CON) 10 MEQ tablet Take 1 tablet by mouth daily.     pramipexole (MIRAPEX) 0.75 MG tablet Take 1 tablet (0.75 mg total) by mouth every evening. 90 tablet 1   temazepam (RESTORIL) 30 MG capsule Take 30 mg by mouth at bedtime as needed.     denosumab (PROLIA) 60 MG/ML SOSY injection Inject 60 mg into the skin every 6 (six) months. (Patient not taking: Reported on 08/02/2023)     Dextromethorphan-buPROPion ER (AUVELITY) 45-105 MG TBCR Take 1 tablet by mouth 2 (two) times daily. 60 tablet 2   L-Methylfolate 15 MG TABS Take 1 tablet (15 mg total) by mouth daily. (Patient not taking: Reported on 06/18/2023) 90 tablet 3   nefazodone (SERZONE) 150 MG tablet Take 2 tablets (300 mg total) by mouth 2 (two) times daily. 120 tablet 2   promethazine (PHENERGAN) 25 MG tablet Take 25 mg by mouth every 6 (six) hours as needed.     Romosozumab-aqqg (EVENITY) 105 MG/1. SOSY injection Inject 210 mg into the skin once. (Patient not taking: Reported on 09/13/2021)     SUMAtriptan (IMITREX) 100 MG tablet Take by mouth.     No current facility-administered medications for this visit.    Medication Side Effects: None  Allergies: No Known  Allergies  Past Medical History:  Diagnosis Date   Cancer (HCC)    Hx of detached retina repair     Past Medical History, Surgical history, Social history, and Family history were reviewed and updated as appropriate.   Please see review of systems for further details on the patient's review from today.   Objective:   Physical Exam:  There were no vitals taken for this visit.  Physical Exam Constitutional:      General: She is not in acute distress. Musculoskeletal:        General: No deformity.  Neurological:     Mental Status: She is alert and oriented to person, place, and time.     Coordination:  Coordination normal.  Psychiatric:        Attention and Perception: Attention and perception normal. She does not perceive auditory or visual hallucinations.        Mood and Affect: Mood normal. Mood is not anxious or depressed. Affect is not labile, blunt, angry or inappropriate.        Speech: Speech normal.        Behavior: Behavior normal.        Thought Content: Thought content normal. Thought content is not paranoid or delusional. Thought content does not include homicidal or suicidal ideation. Thought content does not include homicidal or suicidal plan.        Cognition and Memory: Cognition and memory normal.        Judgment: Judgment normal.     Comments: Insight intact     Lab Review:  No results found for: "NA", "K", "CL", "CO2", "GLUCOSE", "BUN", "CREATININE", "CALCIUM", "PROT", "ALBUMIN", "AST", "ALT", "ALKPHOS", "BILITOT", "GFRNONAA", "GFRAA"  No results found for: "WBC", "RBC", "HGB", "HCT", "PLT", "MCV", "MCH", "MCHC", "RDW", "LYMPHSABS", "MONOABS", "EOSABS", "BASOSABS"  No results found for: "POCLITH", "LITHIUM"   No results found for: "PHENYTOIN", "PHENOBARB", "VALPROATE", "CBMZ"   .res Assessment: Plan:   32  minutes spent dedicated to the care of this patient on the date of this encounter to include pre-visit review of records, ordering of medication, post visit documentation, and face-to-face time with the patient discussing treatment plan and cost of her medications. Pt reports that she is going to contact her insurance and explore possible cost savings for medications. She reports that she would like to continue current medications despite higher cost since benefits outweigh cost at this time.  Will continue current plan of care since target signs and symptoms are well controlled without any tolerability issues. Continue Nefazodone 300 mg twice daily for depression and anxiety.  Will continue Auvelity 45-105 mg one tablet twice daily for depression. Pt to follow-up  in 3 months or sooner if clinically indicated.  Patient advised to contact office with any questions, adverse effects, or acute worsening in signs and symptoms.   [name] was seen today for follow-up.  Diagnoses and all orders for this visit:  Depression, unspecified depression type -     Dextromethorphan-buPROPion ER (AUVELITY) 45-105 MG TBCR; Take 1 tablet by mouth 2 (two) times daily. -     nefazodone (SERZONE) 150 MG tablet; Take 2 tablets (300 mg total) by mouth 2 (two) times daily.  Generalized anxiety disorder -     nefazodone (SERZONE) 150 MG tablet; Take 2 tablets (300 mg total) by mouth 2 (two) times daily.     Please see After Visit Summary for patient specific instructions.  Future Appointments  Date Time Provider Department Center  10/30/2023 12:45 PM Corie Chiquito,  PMHNP [facility] None    No orders of the defined types were placed in this encounter.   -------------------------------

## 2023-09-20 ENCOUNTER — Telehealth: Payer: Self-pay

## 2023-09-20 NOTE — Telephone Encounter (Signed)
Prior Authorization initiated with [employer]/Med[employer]are for Auvelity 45-105 mg #60/30 day,

## 2023-09-21 NOTE — Telephone Encounter (Signed)
Response was no UM needed, not sure what that means. Will contact pt's pharmacy to see if Rebekah Higgins is able to be processed

## 2023-10-24 ENCOUNTER — Encounter: Payer: Self-pay | Admitting: Psychiatry

## 2023-10-30 ENCOUNTER — Ambulatory Visit (INDEPENDENT_AMBULATORY_CARE_PROVIDER_SITE_OTHER): Payer: Medicare Other | Admitting: Psychiatry

## 2023-10-30 ENCOUNTER — Encounter: Payer: Self-pay | Admitting: Psychiatry

## 2023-10-30 DIAGNOSIS — F411 Generalized anxiety disorder: Secondary | ICD-10-CM | POA: Diagnosis not present

## 2023-10-30 DIAGNOSIS — G2581 Restless legs syndrome: Secondary | ICD-10-CM | POA: Diagnosis not present

## 2023-10-30 DIAGNOSIS — F32A Depression, unspecified: Secondary | ICD-10-CM

## 2023-10-30 MED ORDER — NEFAZODONE HCL 150 MG PO TABS
300.0000 mg | ORAL_TABLET | Freq: Two times a day (BID) | ORAL | 2 refills | Status: DC
Start: 2023-10-30 — End: 2024-01-30

## 2023-10-30 MED ORDER — AUVELITY 45-105 MG PO TBCR
1.0000 | EXTENDED_RELEASE_TABLET | Freq: Two times a day (BID) | ORAL | 2 refills | Status: DC
Start: 2023-10-30 — End: 2024-05-29

## 2023-10-30 MED ORDER — PRAMIPEXOLE DIHYDROCHLORIDE 0.75 MG PO TABS
0.7500 mg | ORAL_TABLET | Freq: Every evening | ORAL | 1 refills | Status: AC
Start: 2023-10-30 — End: 2024-01-28

## 2023-10-30 NOTE — Progress Notes (Unsigned)
Rebekah Higgins [id number] [birth year] 67 y.o.  Subjective:   Patient ID:  Rebekah Higgins is a 67 y.o. (DOB 26-Sep-1956) female.  Chief Complaint: No chief complaint on file.   HPI Rebekah Higgins presents to the office today for follow-up of *** She reports low mood. She reports, "I feel like I am struggling to get through the day." She reports that she stays up late and then sleeps later. She report that later in the evening is "the calmest time." She reports that her energy and motivation are somewhat low. She reports that "once I get going,  I am good." She reports that her appetite has been good and she has been trying to lose weight. Denies anhedonia. She reports that her concentration is "not bad" and consistent with baseline. Denies anxiety.    She reports that she tends to be late and this irritates her husband. She reports that her husband "hovers over me." She reports that he starts to panic if she does not replay to his text in 15 minutes after she had a fall months ago. She reports that he has difficulty with hearing and she gets irritated with having to repeat herself.   Denies SI.   They joined a Systems analyst a few weeks ago. She and her husband take dance lessons twice a week.   Married almost 44 years.   Review of Systems:  Review of Systems  Musculoskeletal:  Negative for gait problem.       Knee and ankle pain after sliding out of shower  Neurological:  Negative for tremors.  Psychiatric/Behavioral:         Please refer to HPI    Medications: I have reviewed the patient's current medications.  Current Outpatient Medications  Medication Sig Dispense Refill  . amphetamine-dextroamphetamine (ADDERALL) 10 MG tablet Take by mouth.    . Ascorbic Acid (VITAMIN C PO) Take by mouth.    Marland Kitchen CALCIUM PO Take by mouth.    . cholecalciferol (VITAMIN D3) 25 MCG (1000 UNIT) tablet Take 1,000 Units by mouth daily.    Marland Kitchen denosumab (PROLIA) 60 MG/ML SOSY injection Inject 60 mg into the skin  every 6 (six) months. (Patient not taking: Reported on 08/02/2023)    . Dextromethorphan-buPROPion ER (AUVELITY) 45-105 MG TBCR Take 1 tablet daily for 3 days, then increase to 1 tablet twice daily 30 tablet 0  . Dextromethorphan-buPROPion ER (AUVELITY) 45-105 MG TBCR Take 1 tablet by mouth 2 (two) times daily. 60 tablet 2  . famotidine (PEPCID) 20 MG tablet Take 20 mg by mouth daily as needed.    Marland Kitchen L-Methylfolate 15 MG TABS Take 1 tablet (15 mg total) by mouth daily. (Patient not taking: Reported on 06/18/2023) 90 tablet 3  . lisdexamfetamine (VYVANSE) 60 MG capsule Take 60 mg by mouth every morning.    . Multiple Vitamin (MULTIVITAMIN) tablet Take by mouth.    . nefazodone (SERZONE) 150 MG tablet Take 2 tablets (300 mg total) by mouth 2 (two) times daily. 120 tablet 2  . pantoprazole (PROTONIX) 40 MG tablet Take 40 mg by mouth every morning.    . potassium chloride (KLOR-CON) 10 MEQ tablet Take 1 tablet by mouth daily.    . pramipexole (MIRAPEX) 0.75 MG tablet Take 1 tablet (0.75 mg total) by mouth every evening. 90 tablet 1  . promethazine (PHENERGAN) 25 MG tablet Take 25 mg by mouth every 6 (six) hours as needed.    . Romosozumab-aqqg (EVENITY) 105 MG/1. SOSY injection Inject 210 mg  into the skin once. (Patient not taking: Reported on 09/13/2021)    . SUMAtriptan (IMITREX) 100 MG tablet Take by mouth.    . temazepam (RESTORIL) 30 MG capsule Take 30 mg by mouth at bedtime as needed.     No current facility-administered medications for this visit.    Medication Side Effects: Other: Nausea on occasion after taking medications  Allergies: No Known Allergies  Past Medical History:  Diagnosis Date  . Cancer (HCC)   . Hx of detached retina repair     Past Medical History, Surgical history, Social history, and Family history were reviewed and updated as appropriate.   Please see review of systems for further details on the patient's review from today.   Objective:   Physical Exam:   There were no vitals taken for this visit.  Physical Exam  Lab Review:  No results found for: "NA", "K", "CL", "CO2", "GLUCOSE", "BUN", "CREATININE", "CALCIUM", "PROT", "ALBUMIN", "AST", "ALT", "ALKPHOS", "BILITOT", "GFRNONAA", "GFRAA"  No results found for: "WBC", "RBC", "HGB", "HCT", "PLT", "MCV", "MCH", "MCHC", "RDW", "LYMPHSABS", "MONOABS", "EOSABS", "BASOSABS"  No results found for: "POCLITH", "LITHIUM"   No results found for: "PHENYTOIN", "PHENOBARB", "VALPROATE", "CBMZ"   .res Assessment: Plan:    There are no diagnoses linked to this encounter.   Please see After Visit Summary for patient specific instructions.  No future appointments.  No orders of the defined types were placed in this encounter.   -------------------------------

## 2024-01-30 ENCOUNTER — Other Ambulatory Visit: Payer: Self-pay

## 2024-01-30 DIAGNOSIS — F32A Depression, unspecified: Secondary | ICD-10-CM

## 2024-01-30 DIAGNOSIS — F411 Generalized anxiety disorder: Secondary | ICD-10-CM

## 2024-01-30 MED ORDER — NEFAZODONE HCL 150 MG PO TABS: 300.0000 mg | ORAL_TABLET | Freq: Two times a day (BID) | ORAL | 0 refills | Status: AC

## 2024-03-01 ENCOUNTER — Telehealth: Payer: Self-pay | Admitting: Psychiatry

## 2024-03-01 DIAGNOSIS — F411 Generalized anxiety disorder: Secondary | ICD-10-CM

## 2024-03-01 DIAGNOSIS — F32A Depression, unspecified: Secondary | ICD-10-CM

## 2024-03-01 NOTE — Telephone Encounter (Signed)
 Was Rebekah Higgins's pt. She does not have an appt with another [facility] provider. Please call to see if she is following with Shanda Bumps.

## 2024-03-03 NOTE — Telephone Encounter (Signed)
 Rebekah Higgins is hoping to follow Rebekah Higgins.  She will be calling [facility] to see if they take her insurance.  She will let us know whether or not she can follow her or needs to remain here.

## 2024-03-03 NOTE — Telephone Encounter (Signed)
 I refilled this last month.  She needs to call and get an appt with Shanda Bumps before I will renew it.

## 2024-03-04 NOTE — Telephone Encounter (Signed)
 Rx for Serzone was denied. She needs to get an appt with Shanda Bumps or another provider. Told her if she gets an appt with Shanda Bumps to let us know date and we could fill until that date. If unable to see Shanda Bumps she needs an appt with another provider, either here is availability or elsewhere. Provided her the # for [facility] from letter that was sent in November.

## 2024-03-04 NOTE — Telephone Encounter (Signed)
 LVM to [facility]

## 2024-05-28 ENCOUNTER — Other Ambulatory Visit: Payer: Self-pay | Admitting: Psychiatry

## 2024-05-28 DIAGNOSIS — F32A Depression, unspecified: Secondary | ICD-10-CM

## 2024-05-28 DIAGNOSIS — F411 Generalized anxiety disorder: Secondary | ICD-10-CM

## 2024-05-28 NOTE — Telephone Encounter (Signed)
 Provider no longer here.  She needs to get it from new provider.

## 2024-05-29 ENCOUNTER — Other Ambulatory Visit: Payer: Self-pay

## 2024-05-29 DIAGNOSIS — F32A Depression, unspecified: Secondary | ICD-10-CM

## 2024-05-29 MED ORDER — AUVELITY 45-105 MG PO TBCR: 1.0000 | EXTENDED_RELEASE_TABLET | Freq: Two times a day (BID) | ORAL | 0 refills | Status: AC
